# Patient Record
Sex: Female | Born: 1970 | Race: White | Hispanic: No | Marital: Married | Smoking: Former smoker
Health system: Southern US, Community
[De-identification: ages and names within clinical notes are randomized; demographics above are authoritative.]

## PROBLEM LIST (undated history)

## (undated) DIAGNOSIS — F419 Anxiety disorder, unspecified: Secondary | ICD-10-CM

## (undated) DIAGNOSIS — C50919 Malignant neoplasm of unspecified site of unspecified female breast: Secondary | ICD-10-CM

## (undated) HISTORY — PX: MANDIBLE FRACTURE SURGERY: SHX706

## (undated) HISTORY — PX: MASTECTOMY W/ SENTINEL NODE BIOPSY: SHX2001

## (undated) HISTORY — DX: Malignant neoplasm of unspecified site of unspecified female breast: C50.919

## (undated) HISTORY — PX: APPENDECTOMY: SHX54

## (undated) HISTORY — PX: LASIK: SHX215

## (undated) HISTORY — DX: Anxiety disorder, unspecified: F41.9

---

## 2008-10-03 DIAGNOSIS — C50919 Malignant neoplasm of unspecified site of unspecified female breast: Secondary | ICD-10-CM

## 2008-10-03 HISTORY — DX: Malignant neoplasm of unspecified site of unspecified female breast: C50.919

## 2014-07-03 ENCOUNTER — Other Ambulatory Visit: Payer: Self-pay | Admitting: Hematology and Oncology

## 2014-07-03 ENCOUNTER — Other Ambulatory Visit: Payer: Self-pay | Admitting: *Deleted

## 2014-07-03 DIAGNOSIS — Z9011 Acquired absence of right breast and nipple: Secondary | ICD-10-CM

## 2014-07-03 DIAGNOSIS — Z9012 Acquired absence of left breast and nipple: Secondary | ICD-10-CM

## 2014-07-03 DIAGNOSIS — N644 Mastodynia: Secondary | ICD-10-CM

## 2014-07-04 ENCOUNTER — Ambulatory Visit
Admission: RE | Admit: 2014-07-04 | Discharge: 2014-07-04 | Disposition: A | Discharge status: Home / Self Care | Payer: 59 | Source: Ambulatory Visit | Attending: *Deleted | Admitting: *Deleted

## 2014-07-04 DIAGNOSIS — N644 Mastodynia: Secondary | ICD-10-CM

## 2014-07-04 DIAGNOSIS — Z9012 Acquired absence of left breast and nipple: Secondary | ICD-10-CM

## 2014-07-04 DIAGNOSIS — Z9011 Acquired absence of right breast and nipple: Secondary | ICD-10-CM

## 2014-10-23 ENCOUNTER — Telehealth: Payer: Self-pay | Admitting: *Deleted

## 2014-10-23 NOTE — Telephone Encounter (Signed)
Received referral from [facility] for med onc appt.  Called pt and obtained past history doctor(s) information.  Informed pt that I would call them to see if they would release records with a signed release.  If not, then I would contact her back to work out a plan to get her signature.  I also informed her that I would be able to get her scheduled w/ Dr. Doris Cheadle once I receive the outside records.  Pt agree's w/ the plan.

## 2014-11-14 ENCOUNTER — Telehealth: Payer: Self-pay | Admitting: *Deleted

## 2014-11-14 DIAGNOSIS — C50912 Malignant neoplasm of unspecified site of left female breast: Secondary | ICD-10-CM

## 2014-11-14 NOTE — Telephone Encounter (Signed)
Spoke to patient and confirmed new patient appointment for 04/14/15 at 4pm.  Mailed intake form and packet to patient.  Contact information given and encouraged her to call with any questions or concerns.

## 2014-12-09 ENCOUNTER — Ambulatory Visit: Payer: Self-pay | Admitting: Gastroenterology

## 2015-02-23 ENCOUNTER — Other Ambulatory Visit: Payer: Self-pay

## 2015-04-13 NOTE — Progress Notes (Signed)
[place]  Telephone:([phone]) [phone] Fax:(336) [phone]     ID: Meghan Russell DOB: 21-Nov-1970  MR#: [id number]  IFO#:[id number]  Patient Care Team: Velna Hatchet, MD as PCP - General (Internal Medicine) PCP: Velna Hatchet, MD GYN: SU:  OTHER MD:  CHIEF COMPLAINT: Triple positive breast cancer  CURRENT TREATMENT: Observation   BREAST CANCER HISTORY: Meghan Russell noted a change in her right breast shortly after the birth of her son in 2010. This took her to mammography, and it turned out the right breast was fine but there was an abnormality in the left breast. On 09/11/2009 she underwent left breast ultrasound-guided biopsy which showed [phone] in [facility]) and invasive carcinoma which was estrogen receptor 81% positive, progesterone receptor 100% positive, HER-2 by IH C was 3.8 (overexpressed), with an MIB-1 of 30%. P53 was 3%. Fish confirmed the HER-2 positivity with a signals ratio of 6.6.  The patient explore various surgical options and decided to undergo bilateral mastectomies with D IEP reconstruction. This was performed on 11/12/2009, with left sentinel lymph node sampling. The accession number is [id number] at [facility] in [place], [place] The right prophylactic mastectomy was benign. On the left side there was no evidence of residual tumor on initial views, but gross recut section showed an infiltrating ductal carcinoma, intermediate grade, measuring 1.2 cm. HER-2 was repeated and was again amplified with a ratio of 6.2 estrogen receptor was 90% positive with moderate intensity, progesterone receptor was 90% positive with strong intensity, Ki-67 was 40%. The single sentinel lymph node was benign.  She received adjuvant chemotherapy in [facility] consisting of docetaxel, carboplatin and trastuzumab x6, trastuzumab was continued to complete a year. Echocardiogram 08/12/2010 showed an ejection fraction in the  70% range  The patient was started on tamoxifen August 2011, took it intermittently until January 2014 (perhaps a total of 2 or 2-1/2 years) before discontinuing it because of problems with hot flashes.  Her subsequent history is as detailed below.  INTERVAL HISTORY: Meghan Russell was evaluated in the breast clinic 04/14/2015.  REVIEW OF SYSTEMS: She goes to [employer] and in general is physically active. A detailed review of systems today was otherwise noncontributory  PAST MEDICAL HISTORY: Past Medical History  Diagnosis Date  . Breast cancer     PAST SURGICAL HISTORY: Past Surgical History  Procedure Laterality Date  . Appendectomy    . Mastectomy w/ sentinel node biopsy    . Lasik      FAMILY HISTORY No family history on file. The patient's parents are still living, both 54 years old as of July 2016. The patient had 2 brothers, no sisters. The only cancer in the family was the patient's mother, diagnosed with colon cancer at age 71. More specifically there is no history of breast or ovarian cancer in the family to the patient's knowledge  GYNECOLOGIC HISTORY:  No LMP recorded. Menarche age 37, first live birth age 77, the patient is [place] P2. Her most recent period was 01/27/2015. 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 Her husband Herbie Baltimore works for [employer] as a Freight forwarder. Their children are [name] 9, and [name] 6. The patient at tends [place]    ADVANCED DIRECTIVES: Not in place   HEALTH MAINTENANCE: History  Substance Use Topics  . Smoking status: Not on file  . Smokeless  tobacco: Not on file  . Alcohol Use: Not on file     Colonoscopy:  PAP: June 2015  Bone density:  Lipid panel:  Allergies  Allergen Reactions  . Sulfa Antibiotics Hives    Current Outpatient Prescriptions  Medication Sig Dispense Refill  .  buPROPion (WELLBUTRIN SR) 150 MG 12 hr tablet Take 150 mg by mouth 2 (two) times daily.  5   No current facility-administered medications for this visit.    OBJECTIVE: Meghan Russell woman in no acute distress  Filed Vitals:   04/14/15 1616  BP: 118/76  Pulse: 82  Temp: 98.1 F (36.7 C)  Resp: 18     Body mass index is 36.09 kg/(m^2).    ECOG FS:0 - Asymptomatic  Ocular: Sclerae unicteric, pupils equal, round and reactive to light Ear-nose-throat: Oropharynx clear and moist Lymphatic: No cervical or supraclavicular adenopathy Lungs no rales or rhonchi, good excursion bilaterally Heart regular rate and rhythm, no murmur appreciated Abd soft, nontender, positive bowel sounds MSK no focal spinal tenderness, no joint edema Neuro: non-focal, well-oriented, appropriate affect Breasts: Status post bilateral mastectomies with D IEP reconstruction. There are no findings of concern on the right. On the left breast upper outer quadrant, towards the nipple, there is an area of induration with slight dimpling. This was identified as fat necrosis on a prior MRI. Both axillae are benign   LAB RESULTS:  CMP     Component Value Date/Time   NA 138 04/14/2015 1604   K 4.0 04/14/2015 1604   CO2 23 04/14/2015 1604   GLUCOSE 87 04/14/2015 1604   BUN 9.9 04/14/2015 1604   CREATININE 0.8 04/14/2015 1604   CALCIUM 9.7 04/14/2015 1604   PROT 7.2 04/14/2015 1604   ALBUMIN 4.3 04/14/2015 1604   AST 14 04/14/2015 1604   ALT 15 04/14/2015 1604   ALKPHOS 88 04/14/2015 1604   BILITOT 0.47 04/14/2015 1604    INo results found for: SPEP, UPEP  Lab Results  Component Value Date   WBC 5.2 04/14/2015   NEUTROABS 3.1 04/14/2015   HGB 13.3 04/14/2015   HCT 39.7 04/14/2015   MCV 87.8 04/14/2015   PLT 217 04/14/2015      Chemistry      Component Value Date/Time   NA 138 04/14/2015 1604   K 4.0 04/14/2015 1604   CO2 23 04/14/2015 1604   BUN 9.9 04/14/2015 1604   CREATININE 0.8 04/14/2015 1604       Component Value Date/Time   CALCIUM 9.7 04/14/2015 1604   ALKPHOS 88 04/14/2015 1604   AST 14 04/14/2015 1604   ALT 15 04/14/2015 1604   BILITOT 0.47 04/14/2015 1604       No results found for: LABCA2  No components found for: LABCA125  No results for input(s): INR in the last 168 hours.  Urinalysis No results found for: COLORURINE, APPEARANCEUR, LABSPEC, PHURINE, GLUCOSEU, HGBUR, BILIRUBINUR, KETONESUR, PROTEINUR, UROBILINOGEN, NITRITE, LEUKOCYTESUR  STUDIES: Outside studies reviewed. In particular on 12/06/2012 the patient underwent bilateral breast MRI at [facility] for evaluation of pain in the left breast with an abnormal ultrasound performed 11/22/2012. The MRI showed an 8 cm area of fat necrosis in the medial aspect of the left breast. There was no MRI evidence of malignancy.  ASSESSMENT: 44 y.o. Meghan Russell woman status post bilateral mastectomies 11/12/2009 for a left sided pT1c N0, stage IA invasive ductal carcinoma, grade 2, triple positive, with an MIB-1 of 40%  (a) status post immediate bilateral  DIEP reconstruction  (b) area of fat necrosis in the left breast documented March 2014  (1) adjuvant chemotherapy consisted of carboplatin, docetaxel and trastuzumab given every 3 weeks 6  (2) trastuzumab was continued to complete a year, with echocardiogram post treatment showing a normal ejection fraction  (3) tamoxifen taken intermittently to total about 2 years, definitively discontinued January 2014  PLAN: I spent approximately one hour with Meghan Russell today reviewing her diagnosis, treatment history, and prognosis. She understands while we are adding pertuzumab to trastuzumab these days, we are doing that mostly in the neoadjuvant setting and she probably would have received the same adjuvant treatment today as she did in 2011  The really new information comes from the SOFT and TEXT studies. This show a significant improvement in prognosis  for Meghan Russell women with estrogen receptor positive breast cancer who undergo chemotherapy and, if they resume menses after chemotherapy, start either goserelin with their anti-estrogens or undergo bilateral salpingo-oophorectomy. Most of the benefit a crude 2 women 35 and under and the benefit in any case was small. The chief point of discussing this today is so Meghan Russell know that it is an option for her.  We then discussed her prognosis, and I would guess after her surgery she would have had a 24% or so chance of disease recurrence. If chemotherapy drops this by one third, bringing it down to 16%, and anti-HER-2 treatment cuts it in half, bringing it down to 8%, then the 2 years of tamoxifen she took may have lowered it an additional 2% or so. This means she has an approximately 6% chance of this breast cancer recurring but more importantly she has a 94% chance of it not coming back.  If she wanted to make those odds even better then she would take anastrozole for at least 3 years. She is interested in considering that once she is menopausal and she did miss her last 2 periods so it is possible that she will become menopausal within the next 2-3 years. I am going to see her on a yearly basis and if she goes for a full year with no. We will check her [place] and estradiol levels and consider anastrozole  We discussed 2 other issues. When she was tested for BRCA1 and 2 I we were not doing the fuller panels we are doing now. She is interested in considering this so I am putting in a referral to our genetics counselor.  A second issue is the area of fat necrosis that she has in the left breast. This is uncomfortable to her. I offered her referral but at this point she wants to think about it.  I will see Meghan Russell again a year from now. She knows to call for any problems that may develop before then.   The patient has a good understanding of the overall plan. She agrees with it. She knows the goal of treatment  in her case is cure. She will call with any problems that may develop before her next visit here.  Chauncey Cruel, MD   04/14/2015 5:25 PM Medical Oncology and Hematology [facility] [address] [place], [place] [address] Tel. [phone]    Fax. [phone]

## 2015-04-14 ENCOUNTER — Other Ambulatory Visit (HOSPITAL_BASED_OUTPATIENT_CLINIC_OR_DEPARTMENT_OTHER): Payer: 59

## 2015-04-14 ENCOUNTER — Ambulatory Visit: Payer: 59

## 2015-04-14 ENCOUNTER — Encounter: Payer: Self-pay | Admitting: Oncology

## 2015-04-14 ENCOUNTER — Encounter (INDEPENDENT_AMBULATORY_CARE_PROVIDER_SITE_OTHER): Payer: Self-pay

## 2015-04-14 ENCOUNTER — Ambulatory Visit (HOSPITAL_BASED_OUTPATIENT_CLINIC_OR_DEPARTMENT_OTHER): Payer: 59 | Admitting: Oncology

## 2015-04-14 VITALS — BP 118/76 | HR 82 | Temp 98.1°F | Resp 18 | Ht 67.0 in | Wt 230.5 lb

## 2015-04-14 DIAGNOSIS — C50912 Malignant neoplasm of unspecified site of left female breast: Secondary | ICD-10-CM

## 2015-04-14 LAB — COMPREHENSIVE METABOLIC PANEL (CC13)
ALK PHOS: 88 U/L (ref 40–150)
ALT: 15 U/L (ref 0–55)
AST: 14 U/L (ref 5–34)
Albumin: 4.3 g/dL (ref 3.5–5.0)
Anion Gap: 10 mEq/L (ref 3–11)
BUN: 9.9 mg/dL (ref 7.0–26.0)
CALCIUM: 9.7 mg/dL (ref 8.4–10.4)
CHLORIDE: 106 meq/L (ref 98–109)
CO2: 23 meq/L (ref 22–29)
CREATININE: 0.8 mg/dL (ref 0.6–1.1)
EGFR: 86 mL/min/{1.73_m2} — ABNORMAL LOW (ref >90)
Glucose: 87 mg/dl (ref 70–140)
POTASSIUM: 4 meq/L (ref 3.5–5.1)
Sodium: 138 mEq/L (ref 136–145)
Total Bilirubin: 0.47 mg/dL (ref 0.20–1.20)
Total Protein: 7.2 g/dL (ref 6.4–8.3)

## 2015-04-14 LAB — CBC WITH DIFFERENTIAL/PLATELET
BASO%: 0.4 % (ref 0.0–2.0)
BASOS ABS: 0 10*3/uL (ref 0.0–0.1)
EOS%: 2.1 % (ref 0.0–7.0)
Eosinophils Absolute: 0.1 10*3/uL (ref 0.0–0.5)
HCT: 39.7 % (ref 34.8–46.6)
HGB: 13.3 g/dL (ref 11.6–15.9)
LYMPH%: 32.4 % (ref 14.0–49.7)
MCH: 29.5 pg (ref 25.1–34.0)
MCHC: 33.6 g/dL (ref 31.5–36.0)
MCV: 87.8 fL (ref 79.5–101.0)
MONO#: 0.3 10*3/uL (ref 0.1–0.9)
MONO%: 5.3 % (ref 0.0–14.0)
NEUT#: 3.1 10*3/uL (ref 1.5–6.5)
NEUT%: 59.8 % (ref 38.4–76.8)
Platelets: 217 10*3/uL (ref 145–400)
RBC: 4.52 10*6/uL (ref 3.70–5.45)
RDW: 13.6 % (ref 11.2–14.5)
WBC: 5.2 10*3/uL (ref 3.9–10.3)
lymph#: 1.7 10*3/uL (ref 0.9–3.3)

## 2015-04-14 NOTE — Progress Notes (Signed)
Checked in new pt with no financial concerns prior to seeing the dr.  Pt has my card for any billing questions, concerns or if financial assistance is needed.  ° °

## 2015-04-15 ENCOUNTER — Telehealth: Payer: Self-pay | Admitting: Oncology

## 2015-04-15 NOTE — Telephone Encounter (Signed)
s.w. pt and advised on Aug and JULY 2017 appt....pt okand aware

## 2015-05-13 ENCOUNTER — Telehealth: Payer: Self-pay | Admitting: Genetic Counselor

## 2015-05-13 NOTE — Telephone Encounter (Signed)
Called Ms. Estill to ask her to bring copy of her previous negative genetic test results from 2010 with her to the appointment, if possible.  Also, she will not be able to make her appt tomorrow and would like to reschedule for Thursday, September 15th at 2 PM.  I have done that and will see her then.

## 2015-05-14 ENCOUNTER — Other Ambulatory Visit: Payer: 59

## 2015-05-14 ENCOUNTER — Encounter: Payer: 59 | Admitting: Genetic Counselor

## 2015-06-18 ENCOUNTER — Encounter: Payer: Self-pay | Admitting: Genetic Counselor

## 2015-06-18 ENCOUNTER — Other Ambulatory Visit: Payer: 59

## 2015-06-18 ENCOUNTER — Ambulatory Visit (HOSPITAL_BASED_OUTPATIENT_CLINIC_OR_DEPARTMENT_OTHER): Payer: 59 | Admitting: Genetic Counselor

## 2015-06-18 DIAGNOSIS — Z853 Personal history of malignant neoplasm of breast: Secondary | ICD-10-CM | POA: Diagnosis not present

## 2015-06-18 DIAGNOSIS — Z800 Family history of malignant neoplasm of digestive organs: Secondary | ICD-10-CM

## 2015-06-18 DIAGNOSIS — Z83719 Family history of colon polyps, unspecified: Secondary | ICD-10-CM

## 2015-06-18 DIAGNOSIS — Z8052 Family history of malignant neoplasm of bladder: Secondary | ICD-10-CM | POA: Diagnosis not present

## 2015-06-18 DIAGNOSIS — Z8371 Family history of colonic polyps: Secondary | ICD-10-CM

## 2015-06-18 DIAGNOSIS — Z8042 Family history of malignant neoplasm of prostate: Secondary | ICD-10-CM

## 2015-06-18 NOTE — Progress Notes (Signed)
REFERRING PROVIDER: Lurline Del, MD  PRIMARY PROVIDER:  Velna Hatchet, MD  PRIMARY REASON FOR VISIT:  1. History of left breast cancer   2. Family history of colon cancer in mother   34. Family history of prostate cancer   4. Family history of bladder cancer   5. Family history of colonic polyps      HISTORY OF PRESENT ILLNESS:   Meghan Russell, a 44 y.o. female, was seen for a  cancer genetics consultation at the request of Dr. Jana Russell due to a personal history of breast cancer diagnosed at 19 and family history of colon, prostate, and other cancers.  Meghan Russell previously had negative genetic testing in 2010, which she reports was negative. Meghan Russell presents to clinic today to discuss the possibility of a hereditary predisposition to cancer, updated genetic testing options, and to further clarify her future cancer risks, as well as potential cancer risks for family members.   In 2010, at the age of 68, Meghan Russell was diagnosed with invasive ductal carcinoma of the left breast along with fibrocystic changes in her right breast.  Hormone receptor status was triple positive. This was treated with bilateral mastectomies.    CANCER HISTORY:   No history exists.     HORMONAL RISK FACTORS:  Menarche was at age 63.  First live birth at age 44.  OCP use for approximately 15 years.  Ovaries intact: yes.  Hysterectomy: no.  Menopausal status: premenopausal.  HRT use: 0 years. Colonoscopy: no; not examined. Mammogram within the last year: n/a. Number of breast biopsies: 1. Up to date with pelvic exams:  yes. Any excessive radiation exposure in the past:  no  Past Medical History  Diagnosis Date  . Breast cancer     Past Surgical History  Procedure Laterality Date  . Appendectomy    . Mastectomy w/ sentinel node biopsy    . Lasik      Social History   Social History  . Marital Status: Married    Spouse Name: N/A  . Number of Children: N/A  . Years of  Education: N/A   Social History Main Topics  . Smoking status: Former Smoker -- 0.25 packs/day for 15 years    Types: Cigarettes    Quit date: 10/03/2013  . Smokeless tobacco: Never Used     Comment: sometimes on and off; did not smoke for 4 yrs following her breast cancer dx  . Alcohol Use: 3.0 oz/week    5 Cans of beer per week     Comment: socially/on the weekends  . Drug Use: None  . Sexual Activity: Not Asked   Other Topics Concern  . None   Social History Narrative     FAMILY HISTORY:  We obtained a detailed, 4-generation family history.  Significant diagnoses are listed below: Family History  Problem Relation Age of Onset  . Colon cancer Mother 49    2-3 colon polyps  . Prostate cancer Maternal Grandfather 85  . Colon cancer Maternal Grandfather     dx. >76  . Bone cancer Maternal Grandfather     dx. >76  . Bladder Cancer Paternal Grandmother 48    Meghan Russell has one daughter, age 46, and one son, age 80.  She has two full brothers, ages 75 and 12, who have never had cancer.  She has three nephews between the ages of 23 and 56.  Meghan Russell mother is currently 10; she has a history of breast cancer at  44.  She reports about 2-3 colon polyps total.  Meghan Russell father is currently 42 and has never had cancer or any colon polyps.    Meghan Russell mother has one full brother and one full sister, ages 11 and 41.  Neither Meghan Russell's maternal aunt or maternal uncle have ever had cancer, nor have their children. Meghan Russell maternal grandmother died at 48 and never had cancer.  Meghan Russell maternal grandfather died at 76.  He was diagnosed with prostate cancer at 66, then was diagnosed with colon cancer, and finally with bone cancer which may have been a metastasis.  He was a smoker.  Meghan Russell has no information for maternal great aunts/uncles or maternal grandparents.  Meghan Russell father has one full sister who is in her early 37s and has never had cancer.  This aunt has one son  and one daughter who have never had cancer.  Meghan Russell paternal grandmother died in her late 86s of a MRSA infection following a hospital stay due to a diagnosis of bladder cancer at 43.  Meghan Russell paternal grandfather died of a stroke following an operation.  Meghan Russell has no further information for any paternal great aunts/uncles or great grandparents.  Patient's maternal ancestors are of [place] descent, and paternal ancestors are of [place] descent. There is no reported Ashkenazi Jewish ancestry. There is no known consanguinity.  GENETIC COUNSELING ASSESSMENT: Meghan Russell is a 44 y.o. female with a personal and family history of cancer which is somewhat suggestive of a hereditary cancer syndrome and predisposition to cancer. We, therefore, discussed and recommended the following at today's visit.   DISCUSSION: We reviewed the characteristics, features and inheritance patterns of hereditary cancer syndromes, particularly those caused by mutations within the BRCA1/2, CHEK2, and Lynch syndrome genes. We also discussed genetic testing, including the appropriate family members to test, the process of testing, insurance coverage and turn-around-time for results. We discussed the implications of a negative, positive and/or variant of uncertain significant result. We recommended Meghan Russell pursue genetic testing for the 32-gene Comprehensive Cancer Panel through [facility] Meghan Pigeon, MD).  The Comprehensive Cancer Panel offered by [facility] includes sequencing and deletion/duplication testing of the following 30 genes: APC, ATM, AXIN2, BARD1, BMPR1A, BRCA1, BRCA2, BRIP1, CDH1, CDK4, CDKN2A, CHEK2, FANCC, MLH1, MSH2, MSH6, MUTYH, NBN, PALB2, PMS2, POLD1, POLE, PTEN, RAD51C, RAD51D, SMAD4, STK11, TP53, VHL, and XRCC2.  This panel also includes deletion/duplication analysis (without sequencing) for two genes, EPCAM and SCG5/GREM1.   Based on [name]'s personal and family history of cancer, she meets  medical criteria for genetic testing. Despite that she meets criteria, she may still have an out of pocket cost. We discussed that if her out of pocket cost for testing is over $100, the laboratory will call and confirm whether she wants to proceed with testing.  If the out of pocket cost of testing is less than $100 she will be billed by the genetic testing laboratory.    PLAN: After considering the risks, benefits, and limitations, Ms. Deloach  provided informed consent to pursue genetic testing and the blood sample was sent to [facility] for analysis of the 32-gene Comprehensive Cancer Panel. Results should be available within approximately 2-3 weeks' time, at which point they will be disclosed by telephone to Ms. Cianci, as will any additional recommendations warranted by these results. Ms. Gibbs will receive a summary of her genetic counseling visit and a copy of her results once available. This information will also  be available in Epic. We encouraged Ms. Hinderman to remain in contact with cancer genetics annually so that we can continuously update the family history and inform her of any changes in cancer genetics and testing that may be of benefit for her family. Ms. Happel questions were answered to her satisfaction today. Our contact information was provided should additional questions or concerns arise.  Thank you for the referral and allowing [place] to share in the care of your patient.   Jeanine Luz, MS Genetic Counselor [email or web address] phone: [phone]  The patient was seen for a total of 60 minutes in face-to-face genetic counseling.  This patient was discussed with Drs. Magrinat, Lindi Adie and/or Burr Medico who agrees with the above.    _______________________________________________________________________ For Office Staff:  Number of people involved in session: 1 Was an Intern/ student involved with case: no

## 2015-07-15 ENCOUNTER — Telehealth: Payer: Self-pay | Admitting: Genetic Counselor

## 2015-07-15 NOTE — Telephone Encounter (Signed)
Discussed with Meghan Russell that her genetic testing is on hold currently because [facility] have left her a couple of messages regarding her out-of-pocket cost and she has to give them the go-ahead before they can proceed any further.  [facility] is quoting an out-of-pocket currently of $1,190.50.  I discussed with her that the billing representative at the lab feels confident that we can get this cost down, but she would need to call them and answer some questions concerning her income and household number, etc.  I also discussed with Meghan Russell that we can also try potentially cheaper options, such as re-requisitioning for a smaller panel test or proceeding with testing through a different lab, in the even that the newest out-of-pocket quote is still not in a comfortable range for her.  Meghan Russell is considering cancelling testing altogether; she says she feels most comfortable at a price point of $100 or less.  She was driving at the time of our discussion, so she asked me to call her cell phone again and leave a message with this information and the lab's number.  She plans to call [facility] to find out more tomorrow.

## 2015-08-07 ENCOUNTER — Telehealth: Payer: Self-pay | Admitting: Genetic Counselor

## 2015-08-07 NOTE — Telephone Encounter (Signed)
Left a message for Meghan Russell that she could call [facility] at [phone] to cancel genetic testing.  She is always welcome to call me if she changes her mind or has any questions.  Additionally, let her know that based on NCCN guidelines for colorectal cancer screening and her mom's colon cancer diagnosis at 16, that she could talk to her doctor about starting colonoscopy screening (since she is over the age of 43 based on the guideline).

## 2015-08-12 ENCOUNTER — Telehealth: Payer: Self-pay | Admitting: Genetic Counselor

## 2015-08-12 NOTE — Telephone Encounter (Signed)
[facility] called to confirm that Meghan Russell let them know she would like to cancel genetic testing.  They faxed over a paper for me to sign.  I did so and faxed back to them.

## 2015-10-16 ENCOUNTER — Telehealth: Payer: Self-pay | Admitting: Genetic Counselor

## 2015-10-16 NOTE — Telephone Encounter (Signed)
Last time, Ms. Brasel and I spoke we discussed that she should have a colonoscopy now since her mother was diagnosed with colon cancer at 53.  She is calling back to find out more about how she should go about setting up a colonoscopy.  I left her a message letting her know that she might need a referral from her PCP, but she could call [facility] at [phone] to find out if she needs one and that will be the number she will need to call to set up her appointment.  She is always welcome to call or email me with further questions.

## 2015-11-05 ENCOUNTER — Encounter: Payer: Self-pay | Admitting: Gastroenterology

## 2015-12-29 ENCOUNTER — Encounter: Payer: Self-pay | Admitting: Gastroenterology

## 2015-12-29 ENCOUNTER — Ambulatory Visit (INDEPENDENT_AMBULATORY_CARE_PROVIDER_SITE_OTHER): Payer: 59 | Admitting: Gastroenterology

## 2015-12-29 VITALS — BP 110/60 | HR 60 | Ht 66.0 in | Wt 233.0 lb

## 2015-12-29 DIAGNOSIS — Z1211 Encounter for screening for malignant neoplasm of colon: Secondary | ICD-10-CM

## 2015-12-29 DIAGNOSIS — Z800 Family history of malignant neoplasm of digestive organs: Secondary | ICD-10-CM | POA: Diagnosis not present

## 2015-12-29 DIAGNOSIS — R740 Nonspecific elevation of levels of transaminase and lactic acid dehydrogenase [LDH]: Secondary | ICD-10-CM

## 2015-12-29 DIAGNOSIS — R7401 Elevation of levels of liver transaminase levels: Secondary | ICD-10-CM

## 2015-12-29 MED ORDER — NA SULFATE-K SULFATE-MG SULF 17.5-3.13-1.6 GM/177ML PO SOLN: 1.0000 | Freq: Once | ORAL | Status: DC

## 2015-12-29 NOTE — Progress Notes (Signed)
HPI :  45 y/o female here in consultation for CRC screening. She has a history of breast cancer and anxiety. Also with history of elevated AST / ALT on review of labs.   She has a family history of colon cancer, mother diagnosed at age 51. Grandfather also had colon cancer in his 33s. She denies any blood in the stools. She denies any constipation or diarrhea at baseline. No abdominal pains otherwise.  No eating, no vomiting. No prior colonoscopy. She generally feels well without complaints.  Patient otherwise had a lab draw done by her primary care and was noted to have elevated liver enzymes as outlined below, AST > ALT. She had lab workup done and is awaiting results her her primary care, and also had a RUQ [place] which she states "looked good" but results not available at this time. 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 She had treatment with Herceptin and surgical treatment.  In remission for 6 years at this time. She was taking ibuprofen at the time of this AST elevation and has since stopped. She also had blood work to workup causes of chronic liver enzymes. She denies any mucles aches / pains. She works out routinely, may have worked out prior to lab.   Labs shows 10/26/15 - AST 146, ALT 45, AP 69, T bil 0.4   Past Medical History  Diagnosis Date  . Breast cancer ([place])   . Anxiety     Past Surgical History  Procedure Laterality Date  . Appendectomy    . Mastectomy w/ sentinel node biopsy    . Lasik     Family History  Problem Relation Age of Onset  . Colon cancer Mother 33    2-3 colon polyps  . Prostate cancer Maternal Grandfather 26  . Colon cancer Maternal Grandfather     dx. >76  . Bone cancer Maternal Grandfather     dx. >76  . Bladder Cancer Paternal Grandmother 87   Social History  Substance Use Topics  . Smoking status: Former Smoker -- 0.25 packs/day for 15 years    Types: Cigarettes    Quit date: 10/03/2013    . Smokeless tobacco: Never Used     Comment: sometimes on and off; did not smoke for 4 yrs following her breast cancer dx  . Alcohol Use: 3.0 oz/week    5 Cans of beer per week     Comment: socially/on the weekends   Current Outpatient Prescriptions  Medication Sig Dispense Refill  . ALPRAZolam (XANAX) 0.25 MG tablet Take 0.25 mg by mouth at bedtime as needed for anxiety.     No current facility-administered medications for this visit.   Allergies  Allergen Reactions  . Sulfa Antibiotics Hives     Review of Systems: All systems reviewed and negative except where noted in HPI.    No results found.  Labs as outlined above  Physical Exam: BP 110/60 mmHg  Pulse 60  Ht '5\' 6"'  (1.676 m)  Wt 233 lb (105.688 kg)  BMI 37.63 kg/m2  LMP 12/06/2015 Constitutional: Pleasant,well-developed, female in no acute distress. HEENT: Normocephalic and atraumatic. Conjunctivae are normal. No scleral icterus. Neck supple.  Cardiovascular: Normal rate, regular rhythm.  Pulmonary/chest: Effort normal and breath sounds normal. No wheezing, rales or rhonchi. Abdominal: Soft, nondistended, nontender. Bowel sounds active throughout. There are no masses palpable. No hepatomegaly. Extremities: no edema Lymphadenopathy: No cervical adenopathy noted. Neurological: Alert and  oriented to person place and time. Skin: Skin is warm and dry. No rashes noted. Psychiatric: Normal mood and affect. Behavior is normal.   ASSESSMENT AND PLAN: 45 y/o female with strong family history of colon cancer, who is due for screening colonoscopy at this time. She is asymptomatic. I discussed colon cancer screening options with her, and given her family history recommend optical colonoscopy. The indications, risks, and benefits of colonoscopy were explained to the patient in detail. Risks include but are not limited to bleeding, perforation, adverse reaction to medications, and cardiopulmonary compromise. Sequelae include  but are not limited to the possibility of surgery, hospitalization, and mortality. The patient verbalized understanding and wished to proceed. All questions answered, referred to the scheduler and bowel prep ordered. Further recommendations pending results of the exam.   She otherwise has recent blood draw showing AST>ALT elevation. She has had a RUQ [place] and labs to rule out chronic liver diseases as well as CK which I agree with. If her AST / ALT elevation persists, I asked her to contact me so we can further evaluate this issue. I can't see results of her labs on our system today, she will have them sent to our office so I can review them.   Hebron Cellar, MD [facility] Pager [phone]  CC: Velna Hatchet, MD

## 2015-12-29 NOTE — Patient Instructions (Signed)

## 2016-01-07 ENCOUNTER — Telehealth: Payer: Self-pay | Admitting: Oncology

## 2016-01-07 NOTE — Telephone Encounter (Signed)
Lvm advising appt chg on 7/12 from [facility] at 4pm to HB at 3.15, and labs prior at 2.45.

## 2016-02-09 ENCOUNTER — Other Ambulatory Visit: Payer: Self-pay | Admitting: Nurse Practitioner

## 2016-02-10 ENCOUNTER — Ambulatory Visit (AMBULATORY_SURGERY_CENTER): Payer: 59 | Admitting: Gastroenterology

## 2016-02-10 ENCOUNTER — Encounter: Payer: Self-pay | Admitting: Gastroenterology

## 2016-02-10 VITALS — BP 138/68 | HR 68 | Temp 98.9°F | Resp 15 | Ht 66.0 in | Wt 233.0 lb

## 2016-02-10 DIAGNOSIS — Z1211 Encounter for screening for malignant neoplasm of colon: Secondary | ICD-10-CM

## 2016-02-10 DIAGNOSIS — D127 Benign neoplasm of rectosigmoid junction: Secondary | ICD-10-CM

## 2016-02-10 DIAGNOSIS — D122 Benign neoplasm of ascending colon: Secondary | ICD-10-CM | POA: Diagnosis not present

## 2016-02-10 DIAGNOSIS — D120 Benign neoplasm of cecum: Secondary | ICD-10-CM | POA: Diagnosis not present

## 2016-02-10 MED ORDER — SODIUM CHLORIDE 0.9 % IV SOLN: 500.0000 mL | INTRAVENOUS | Status: DC

## 2016-02-10 NOTE — Patient Instructions (Signed)
YOU HAD AN ENDOSCOPIC PROCEDURE TODAY AT [facility]:   Refer to the procedure report that was given to you for any specific questions about what was found during the examination.  If the procedure report does not answer your questions, please call your gastroenterologist to clarify.  If you requested that your care partner not be given the details of your procedure findings, then the procedure report has been included in a sealed envelope for you to review at your convenience later.  YOU SHOULD EXPECT: Some feelings of bloating in the abdomen. Passage of more gas than usual.  Walking can help get rid of the air that was put into your GI tract during the procedure and reduce the bloating. If you had a lower endoscopy (such as a colonoscopy or flexible sigmoidoscopy) you may notice spotting of blood in your stool or on the toilet paper. If you underwent a bowel prep for your procedure, you may not have a normal bowel movement for a few days.  Please Note:  You might notice some irritation and congestion in your nose or some drainage.  This is from the oxygen used during your procedure.  There is no need for concern and it should clear up in a day or so.  SYMPTOMS TO REPORT IMMEDIATELY:   Following lower endoscopy (colonoscopy or flexible sigmoidoscopy):  Excessive amounts of blood in the stool  Significant tenderness or worsening of abdominal pains  Swelling of the abdomen that is new, acute  Fever of 100F or higher  For urgent or emergent issues, a gastroenterologist can be reached at any hour by calling [phone].   DIET: Your first meal following the procedure should be a small meal and then it is ok to progress to your normal diet. Heavy or fried foods are harder to digest and may make you feel nauseous or bloated.  Likewise, meals heavy in dairy and vegetables can increase bloating.  Drink plenty of fluids but you should avoid alcoholic beverages for 24  hours.  ACTIVITY:  You should plan to take it easy for the rest of today and you should NOT DRIVE or use heavy machinery until tomorrow (because of the sedation medicines used during the test).    FOLLOW UP: Our staff will call the number listed on your records the next business day following your procedure to check on you and address any questions or concerns that you may have regarding the information given to you following your procedure. If we do not reach you, we will leave a message.  However, if you are feeling well and you are not experiencing any problems, there is no need to return our call.  We will assume that you have returned to your regular daily activities without incident.  Polyp, diverticulosis, high fiber diet information given.  No aspirin, naproxen, ibuprofen, or any other non-steroidal anti -inflammatory meds for two weeks.  If any biopsies were taken you will be contacted by phone or by letter within the next 1-3 weeks.  Please call us at [phone] if you have not heard about the biopsies in 3 weeks.    SIGNATURES/CONFIDENTIALITY: You and/or your care partner have signed paperwork which will be entered into your electronic medical record.  These signatures attest to the fact that that the information above on your After Visit Summary has been reviewed and is understood.  Full responsibility of the confidentiality of this discharge information lies with you and/or your care-partner.

## 2016-02-10 NOTE — Op Note (Signed)
[facility] [facility] Patient Name: Meghan Russell Procedure Date: 02/10/2016 8:58 AM MRN: [id number] Endoscopist: Remo Lipps P. Havery Moros , MD Age: 45 Date of Birth: 06/02/1971 Gender: Female Procedure:                Colonoscopy Indications:              Screening in patient at increased risk: Family                            history of 1st-degree relative with colorectal                            cancer before age 2 years, This is the patient's                            first colonoscopy Medicines:                Monitored Anesthesia Care Procedure:                Pre-Anesthesia Assessment:                           - Prior to the procedure, a History and Physical                            was performed, and patient medications and                            allergies were reviewed. The patient's tolerance of                            previous anesthesia was also reviewed. The risks                            and benefits of the procedure and the sedation                            options and risks were discussed with the patient.                            All questions were answered, and informed consent                            was obtained. Prior Anticoagulants: The patient has                            taken no previous anticoagulant or antiplatelet                            agents. ASA Grade Assessment: II - A patient with                            mild systemic disease. After reviewing the risks  and benefits, the patient was deemed in                            satisfactory condition to undergo the procedure.                           After obtaining informed consent, the colonoscope                            was passed under direct vision. Throughout the                            procedure, the patient's blood pressure, pulse, and                            oxygen saturations were monitored continuously. The   Model PCF-H190L [id number]) scope was introduced                            through the anus and advanced to the the cecum,                            identified by appendiceal orifice and ileocecal                            valve. The colonoscopy was performed without                            difficulty. The patient tolerated the procedure                            well. The quality of the bowel preparation was                            adequate. The ileocecal valve, appendiceal orifice,                            and rectum were photographed. Scope In: 9:06:13 AM Scope Out: 9:32:45 AM Scope Withdrawal Time: 0 hours 23 minutes 21 seconds  Total Procedure Duration: 0 hours 26 minutes 32 seconds  Findings:                 The perianal and digital rectal examinations were                            normal.                           A 5 mm polyp was found in the cecum. The polyp was                            sessile. The polyp was removed with a cold snare.                            Resection and retrieval were  complete.                           A 4 mm polyp was found in the ascending colon. The                            polyp was sessile. The polyp was removed with a                            cold snare. Resection and retrieval were complete.                           A 3 mm polyp was found in the recto-sigmoid colon.                            The polyp was sessile. The polyp was removed with a                            cold snare. Resection and retrieval were complete.                           A few small-mouthed diverticula were found in the                            left colon.                           The exam was otherwise without abnormality. Complications:            No immediate complications. Estimated blood loss:                            Minimal. Estimated Blood Loss:     Estimated blood loss was minimal. Impression:               - One 5 mm polyp in the cecum,  removed with a cold                            snare. Resected and retrieved.                           - One 4 mm polyp in the ascending colon, removed                            with a cold snare. Resected and retrieved.                           - One 3 mm polyp at the recto-sigmoid colon,                            removed with a cold snare. Resected and retrieved.                           - Diverticulosis in the left colon.                           -  The examination was otherwise normal. Recommendation:           - Patient has a contact number available for                            emergencies. The signs and symptoms of potential                            delayed complications were discussed with the                            patient. Return to normal activities tomorrow.                            Written discharge instructions were provided to the                            patient.                           - Resume previous diet.                           - Continue present medications.                           - Await pathology results.                           - No aspirin, ibuprofen, naproxen, or other                            non-steroidal anti-inflammatory drugs for 2 weeks                            after polyp removal.                           - Repeat colonoscopy is recommended for                            surveillance. The colonoscopy date will be                            determined after pathology results from today's                            exam become available for review. Remo Lipps P. Senita Corredor, MD 02/10/2016 9:36:05 AM This report has been signed electronically.

## 2016-02-10 NOTE — Progress Notes (Signed)
Transferred to recovery vss report to [employer]

## 2016-02-10 NOTE — Progress Notes (Signed)
Called to room to assist during endoscopic procedure.  Patient ID and intended procedure confirmed with present staff. Received instructions for my participation in the procedure from the performing physician.  

## 2016-02-11 ENCOUNTER — Telehealth: Payer: Self-pay

## 2016-02-11 NOTE — Telephone Encounter (Signed)
  Follow up Call-  Call back number 02/10/2016  Post procedure Call Back phone  # [phone]  Permission to leave phone message Yes     Patient questions:  Do you have a fever, pain , or abdominal swelling? No. Pain Score  0 *  Have you tolerated food without any problems? Yes.    Have you been able to return to your normal activities? Yes.    Do you have any questions about your discharge instructions: Diet   No. Medications  No. Follow up visit  No.  Do you have questions or concerns about your Care? No.  Actions: * If pain score is 4 or above: No action needed, pain <4.

## 2016-02-17 ENCOUNTER — Encounter: Payer: Self-pay | Admitting: Gastroenterology

## 2016-04-12 ENCOUNTER — Other Ambulatory Visit: Payer: Self-pay

## 2016-04-12 DIAGNOSIS — C50912 Malignant neoplasm of unspecified site of left female breast: Secondary | ICD-10-CM

## 2016-04-12 NOTE — Patient Instructions (Addendum)
Thank you for coming in today!  As we discussed, please continue to perform your self breast exam and report any changes. If you note any new symptoms (please see below), be sure to notify us ASAP.  We'll have you return in one year's time for your next appointment with Dr. Jana Hakim or sooner if you have any problems.  Please check with Dr. Lodema Hong about your thyroid level and see what he says.  Please call the counselors and see if we can find a good fit so that we can work on your anxiety.  Please be sure to stop by scheduling on your way out to make those appointment(s).  Looking forward to working with you in the future!  Let us know if you have any questions!  Symptoms to Watch for and Report to Your Provider  . Changes along your reconstructed breast . New or unusual pain that seems unrelated to an injury and does not go away, including back pain or bone pain . Weight loss without trying/intending . Unexplained bleeding . A rash or allergic reaction, such as swelling, severe itching or wheezing . Chills or fevers . Persistent headaches . Shortness of breath or difficulty breathing . Bloody stools or blood in your urine . Nausea, vomiting, diarrhea, loss of appetite, or trouble swallowing . A cough that does not go away . Abdominal pain . Swelling in your arms or legs . Fractures . Hot flashes or other menopausal symptoms . Any other signs mentioned by your doctor or nurse or any unusual symptoms                 that you just can't explain   NOTE: Just because you have certain symptoms, it doesn't mean the cancer has come back or you have a new cancer. Symptoms can be due to other problems that need to be addressed.  It is important to watch for these symptoms and report them to your provider so you can be medically evaluated for any of these concerns!     Living a Life of Wellness After Cancer:  *Note: Please consult your health care provider before using any medications, supplements,  over-the-counter products, or other interventions.  Also, please consult your primary care provider before you begin any lifestyle program (diet, exercise, etc.).  Your safety is our top priority and we want to make sure you continue to live a long and healthy life!    Healthy Lifestyle Recommendations  As a cancer survivor, it is important develop a lifelong commitment to a healthy lifestyle. A healthy lifestyle can prevent cancer from returning as well as prevent other diseases like heart disease, diabetes and high blood pressure.  These are some things that you can do to have a healthy lifestyle:  Marland Kitchen Maintain a healthy weight.  . Exercise daily per your doctor's orders. . Eat a balanced diet high in fruits, vegetables, bran, and fiber. Limit intake of red meat      and processed foods.  . Limit how much alcohol you consume, if at all. Ali Lowe regular bone mineral density testing for osteoporosis.  . Talk to your doctor about cardiovascular disease or "heart disease" screening. . Stop smoking (if you smoke). . Know your family history. . Be mindful of your emotional, social, and spiritual needs. . Meet regularly with a Primary Care Provider (PCP). Find a PCP if you do not             already have one. . Talk  to your doctor about regular cancer screening including screening for colon           cancer, GYN cancers, and skin cancer.

## 2016-04-12 NOTE — Progress Notes (Signed)
CLINIC:  Cancer Survivorship   REASON FOR VISIT:  Routine follow-up post-treatment for history of breast cancer.  BRIEF ONCOLOGIC HISTORY:    Breast cancer, left breast ([place])   11/12/2009 Definitive Surgery Bilateral mastectomies: right (benign); left - invasive ductal carcinoma, grade 2, ER+, PR+, HER2/neu +, Ki67 40% with immediate bilateral DIEP reconstruction    Chemotherapy Carboplatin, docetaxel and trastuzumab x 6 with maintenace trastuzumab to complete one year of therapy    - 10/2012 Anti-estrogen oral therapy Tamoxifen 20 mg daily; discontinued due to side effects    INTERVAL HISTORY:  Meghan Russell presents to the [facility] today for ongoing follow up regarding her history of breast cancer. Overall, Ms. Mamaril reports feeling doing well since her last visit with Dr. Jana Hakim last year.  Her insurance did not cover expanded genetic testing, so this was not obtained.  She did undergo colonoscopy in May 2017 due to her mother's history of colorectal cancer at age 39. A tubular adenoma and hyperplastic polyp were identified without evidence of malignacy. She has not noticed any change about either reconstructed breast aside from the known area of fat necrosis along her left breast that we are monitoring. She says that she is interested in seeing the surgeon at [facility] for evaluation of this area as she discussed last year with Dr. Jana Hakim. Her biggest complaint centers around anxiety.  She has been placed on alprazolam, which helps at night, but it makes her sleepy.  She denies any headache, cough, shortness of breath, or bone pain.  She is continuing to menstruate regularly with her last menstrual cycle earlier this month.   She reports a good appetite and denies any weight loss. In fact, she is concerned about the generalized "swelling" she is experiencing and continued weight gain.   REVIEW OF SYSTEMS:  General: Weight gain / swelling.  Denies fever, chills, unintentional weight  loss, or generalized fatigue.  HEENT: Denies visual changes, hearing loss, mouth sores, or difficulty swallowing. Cardiac: Denies palpitations and lower extremity edema.  Respiratory: Denies wheeze or dyspnea on exertion.  Breast: As above. GI: Denies abdominal pain, constipation, diarrhea, nausea, or vomiting.  GU: Denies dysuria, hematuria, vaginal bleeding, vaginal discharge, or vaginal dryness.  Musculoskeletal: As above. Neuro: Denies recent fall or numbness / tingling in her extremities.  Skin: Denies rash, pruritis, or open wounds.  Psych: Anxiety and depression.  Denies insomnia, or memory loss.   A 14-point review of systems was completed and was negative, except as noted above.    PAST MEDICAL/SURGICAL HISTORY:  Past Medical History  Diagnosis Date  . Anxiety   . Breast cancer [facility]) 2010    left breast   Past Surgical History  Procedure Laterality Date  . Appendectomy    . Mastectomy w/ sentinel node biopsy    . Lasik    . Mandible fracture surgery       ALLERGIES:  Allergies  Allergen Reactions  . Sulfa Antibiotics Hives     CURRENT MEDICATIONS:  Current Outpatient Prescriptions on File Prior to Visit  Medication Sig Dispense Refill  . ALPRAZolam (XANAX) 0.25 MG tablet Take 0.25 mg by mouth at bedtime as needed for anxiety.     No current facility-administered medications on file prior to visit.     ONCOLOGIC FAMILY HISTORY:  Family History  Problem Relation Age of Onset  . Colon cancer Mother 76    2-3 colon polyps  . Prostate cancer Maternal Grandfather 20  . Colon cancer Maternal  Grandfather     dx. >76  . Bone cancer Maternal Grandfather     dx. >76  . Bladder Cancer Paternal Grandmother 87     GENETIC COUNSELING/TESTING: Yes, performed 2010: BRCA1/2 negative.  SOCIAL HISTORY:  Azizi Bally is married and lives with her family in [place], [place].  She has 2 children. 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   She is a former smoker and denies any current or history of illicit drug use. She drinks alcohol on a social basis on the weekends.  PHYSICAL EXAMINATION:  Vital Signs: Filed Vitals:   04/13/16 1414  BP: 116/74  Pulse: 76  Temp: 98.4 F (36.9 C)  Resp: 18   Weight: 242 (increase of 12# since her visit last year.) ECOG performance status: 0 General: Well-nourished, well-appearing female in no acute distress.  She is unaccompanied in clinic today.   HEENT: Head is atraumatic and normocephalic.  Pupils equal and reactive to light and accomodation. Conjunctivae clear without exudate.  Sclerae anicteric. Oral mucosa is pink, moist, and intact without lesions.  Oropharynx is pink without lesions or erythema.  Lymph: No cervical, supraclavicular, infraclavicular, or axillary lymphadenopathy noted on palpation.  Cardiovascular: Regular rate and rhythm without murmurs, rubs, or gallops. Respiratory: Clear to auscultation bilaterally. Chest expansion symmetric without accessory muscle use on inspiration or expiration.  Breast: Bilateral breast exam performed.  Bilaterally reconstructed breasts S/P DIEP with excellent cosmesis.  Small area of induration and hardness previously identified as consistent with fat necrosis at left breast. GI: Abdomen soft and round. No tenderness to palpation. Bowel sounds normoactive in 4 quadrants. No hepatosplenomegaly.   GU: Deferred.  Musculoskeletal: Muscle strength 5/5 in all extremities.   Neuro: No focal deficits. Steady gait.  Psych: Mood and affect normal and appropriate for situation.  Extremities: No edema, cyanosis, or clubbing.  Skin: Warm and dry. No open lesions noted.   LABORATORY DATA:  Recent Results (from the past 2160 hour(s))  CBC with Differential     Status: None   Collection Time: 04/13/16  1:57 PM  Result Value Ref Range   WBC 5.6 3.9 - 10.3 10e3/uL   NEUT# 3.5 1.5 - 6.5 10e3/uL   HGB 13.4 11.6 - 15.9 g/dL   HCT 40.9 34.8 - 46.6 %    Platelets 177 145 - 400 10e3/uL   MCV 87.1 79.5 - 101.0 fL   MCH 28.5 25.1 - 34.0 pg   MCHC 32.7 31.5 - 36.0 g/dL   RBC 4.69 3.70 - 5.45 10e6/uL   RDW 14.1 11.2 - 14.5 %   lymph# 1.6 0.9 - 3.3 10e3/uL   MONO# 0.3 0.1 - 0.9 10e3/uL   Eosinophils Absolute 0.1 0.0 - 0.5 10e3/uL   Basophils Absolute 0.0 0.0 - 0.1 10e3/uL   NEUT% 62.2 38.4 - 76.8 %   LYMPH% 29.0 14.0 - 49.7 %   MONO% 6.1 0.0 - 14.0 %   EOS% 2.1 0.0 - 7.0 %   BASO% 0.6 0.0 - 2.0 %  Comprehensive metabolic panel     Status: Abnormal   Collection Time: 04/13/16  1:57 PM  Result Value Ref Range   Sodium 138 136 - 145 mEq/L   Potassium 3.9 3.5 - 5.1 mEq/L   Chloride 102 98 - 109 mEq/L   CO2 25 22 - 29 mEq/L   Glucose 123 70 - 140 mg/dl    Comment: Glucose reference range is for nonfasting patients. Fasting glucose reference range is 70- 100.  BUN 11.1 7.0 - 26.0 mg/dL   Creatinine 0.8 0.6 - 1.1 mg/dL   Total Bilirubin 0.41 0.20 - 1.20 mg/dL   Alkaline Phosphatase 71 40 - 150 U/L   AST 22 5 - 34 U/L   ALT 19 0 - 55 U/L   Total Protein 7.4 6.4 - 8.3 g/dL   Albumin 4.0 3.5 - 5.0 g/dL   Calcium 9.7 8.4 - 10.4 mg/dL   Anion Gap 11 3 - 11 mEq/L   EGFR 90 (L) >90 ml/min/1.73 m2    Comment: eGFR is calculated using the CKD-EPI Creatinine Equation (2009)        ASSESSMENT AND PLAN:   1. 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  Ms. Casco is doing well with no clinical symptoms worrisome for cancer recurrence at this time. I have reviewed the recommendations for ongoing surveillance with her and she will follow-up with her medical oncologist,  Dr. Jana Hakim, in July 2018   with history and physical exam per surveillance protocol.  As she is still menstruating, we will not check her [place] / LH / estradiol levels at this time. She will continue her anti-estrogen therapy with tamoxifen at this time and report any new side effects.  She was also instructed to make [place] aware if she notes any change within her breast, any new symptoms such as pain, shortness of breath, weight loss, or fatigue.  Though the incidence is low, there is an associated risk of endometrial cancer with anti-estrogen therapies like Tamoxifen.  Ms. Bobeck was encouraged to contact Dr. Julaine Fusi any vaginal bleeding while taking Tamoxifen. Other side effects of Tamoxifen were again reviewed with her as well.  I have given her the names of Dr. Isa Rankin [facility]) and Harle Stanford (in [facility]) to contact regarding her fat necrosis and asked her to notify us if she has difficulty making those appointments.    2. Anxiety: Ms. Mentel attempted to make an appointment with the psychologist recommended to her by her PCP, Dr. Ardeth Perfect, but unfortunately, her insurance did not cover this provider.  She has the name of some additional providers and I have also recommended to her some potential providers to contact.  I do believe that she needs a long acting agent as well as counseling to aid in her symptoms.  She is not depressed today.  She is very concerned about additional weight gain, which may be problematic with the side effect profile of the various agents.  She has agreed to reach out to the providers ASAP as it may take time to get her in.  3. Weight gain / swelling: I believe that Ms. Neira's complaints of swelling are more related to the weight gain rather than swelling.  Her laboratory functions are WNL today and she shows no evidence of edema or effusion.  I have encouraged her to contact Dr. Ardeth Perfect to see if he has checked a TSH level to see if this is impacting her weight gain.  She states that she is  watching her carbohydrate intake and exercising regularly.  She has met with a nutritionist, as well.    4. Cancer screening:  Due to Ms. Eley's history and her age, she should  receive screening for skin cancers, colon cancer (per her gastroenterologist's recommendations), and gynecologic cancers.  The information and recommendations were shared with the patient and in her written after visit summary.  5. Health maintenance and wellness promotion:Ms. Collings and I discussed recommendations to maximize nutrition and minimize recurrence, such as increased intake of fruits, vegetables, lean proteins, and minimizing the intake of red meats and processed foods.  She was also encouraged to engage in moderate to vigorous exercise for 30 minutes per day most days of the week. She was instructed to limit her alcohol consumption and continue to abstain from tobacco use.    6. Support services/counseling:Ms. Leclaire was offered support today through active listening and expressive supportive counseling.     A total of 30 minutes of face-to-face time was spent with this patient with greater than 50% of that time in counseling and care-coordination.   Sylvan Cheese, NP  Survivorship Program [facility] [phone]   Note: PRIMARY CARE PROVIDER Velna Hatchet, [place] [phone]

## 2016-04-13 ENCOUNTER — Ambulatory Visit (HOSPITAL_BASED_OUTPATIENT_CLINIC_OR_DEPARTMENT_OTHER): Payer: 59 | Admitting: Nurse Practitioner

## 2016-04-13 ENCOUNTER — Other Ambulatory Visit (HOSPITAL_BASED_OUTPATIENT_CLINIC_OR_DEPARTMENT_OTHER): Payer: 59

## 2016-04-13 ENCOUNTER — Encounter: Payer: Self-pay | Admitting: Nurse Practitioner

## 2016-04-13 ENCOUNTER — Telehealth: Payer: Self-pay | Admitting: Nurse Practitioner

## 2016-04-13 VITALS — BP 116/74 | HR 76 | Temp 98.4°F | Resp 18 | Wt 242.3 lb

## 2016-04-13 DIAGNOSIS — Z853 Personal history of malignant neoplasm of breast: Secondary | ICD-10-CM | POA: Diagnosis not present

## 2016-04-13 DIAGNOSIS — R609 Edema, unspecified: Secondary | ICD-10-CM | POA: Diagnosis not present

## 2016-04-13 DIAGNOSIS — R635 Abnormal weight gain: Secondary | ICD-10-CM

## 2016-04-13 DIAGNOSIS — F419 Anxiety disorder, unspecified: Secondary | ICD-10-CM | POA: Diagnosis not present

## 2016-04-13 DIAGNOSIS — C50912 Malignant neoplasm of unspecified site of left female breast: Secondary | ICD-10-CM

## 2016-04-13 LAB — COMPREHENSIVE METABOLIC PANEL
ALBUMIN: 4 g/dL (ref 3.5–5.0)
ALT: 19 U/L (ref 0–55)
AST: 22 U/L (ref 5–34)
Alkaline Phosphatase: 71 U/L (ref 40–150)
Anion Gap: 11 mEq/L (ref 3–11)
BILIRUBIN TOTAL: 0.41 mg/dL (ref 0.20–1.20)
BUN: 11.1 mg/dL (ref 7.0–26.0)
CO2: 25 meq/L (ref 22–29)
CREATININE: 0.8 mg/dL (ref 0.6–1.1)
Calcium: 9.7 mg/dL (ref 8.4–10.4)
Chloride: 102 mEq/L (ref 98–109)
EGFR: 90 mL/min/{1.73_m2} — ABNORMAL LOW (ref >90)
GLUCOSE: 123 mg/dL (ref 70–140)
Potassium: 3.9 mEq/L (ref 3.5–5.1)
SODIUM: 138 meq/L (ref 136–145)
TOTAL PROTEIN: 7.4 g/dL (ref 6.4–8.3)

## 2016-04-13 LAB — CBC WITH DIFFERENTIAL/PLATELET
BASO%: 0.6 % (ref 0.0–2.0)
Basophils Absolute: 0 10*3/uL (ref 0.0–0.1)
EOS%: 2.1 % (ref 0.0–7.0)
Eosinophils Absolute: 0.1 10*3/uL (ref 0.0–0.5)
HCT: 40.9 % (ref 34.8–46.6)
HEMOGLOBIN: 13.4 g/dL (ref 11.6–15.9)
LYMPH%: 29 % (ref 14.0–49.7)
MCH: 28.5 pg (ref 25.1–34.0)
MCHC: 32.7 g/dL (ref 31.5–36.0)
MCV: 87.1 fL (ref 79.5–101.0)
MONO#: 0.3 10*3/uL (ref 0.1–0.9)
MONO%: 6.1 % (ref 0.0–14.0)
NEUT%: 62.2 % (ref 38.4–76.8)
NEUTROS ABS: 3.5 10*3/uL (ref 1.5–6.5)
PLATELETS: 177 10*3/uL (ref 145–400)
RBC: 4.69 10*6/uL (ref 3.70–5.45)
RDW: 14.1 % (ref 11.2–14.5)
WBC: 5.6 10*3/uL (ref 3.9–10.3)
lymph#: 1.6 10*3/uL (ref 0.9–3.3)

## 2016-04-13 NOTE — Telephone Encounter (Signed)
appt made and avs printed °

## 2016-11-18 ENCOUNTER — Encounter (HOSPITAL_COMMUNITY): Payer: Self-pay

## 2017-03-31 ENCOUNTER — Other Ambulatory Visit: Payer: Self-pay

## 2017-03-31 DIAGNOSIS — C50912 Malignant neoplasm of unspecified site of left female breast: Secondary | ICD-10-CM

## 2017-04-02 NOTE — Progress Notes (Signed)
[facility]  Telephone:([phone]) [phone] [phone]) [phone]     ID: Meghan Russell DOB: Dec 13, 1970  MR#: [id number]  QUI#:[id number]  Patient Care Team: Velna Hatchet, MD as PCP - General (Internal Medicine) Trell Secrist, Virgie Dad, MD as Consulting Physician (Oncology) Armbruster, Renelda Loma, MD as Consulting Physician (Gastroenterology) Aloha Gell, MD as Consulting Physician (Obstetrics and Gynecology) PCP: Velna Hatchet, MD OTHER MD:  CHIEF COMPLAINT: Triple positive breast cancer  CURRENT TREATMENT: Observation   BREAST CANCER HISTORY: From the original intake note:  Meghan Russell noted a change in her right breast shortly after the birth of her son in 2010. This took her to mammography, and it turned out the right breast was fine but there was an abnormality in the left breast. On 09/11/2009 she underwent left breast ultrasound-guided biopsy which showed [phone] in [facility]) and invasive carcinoma which was estrogen receptor 81% positive, progesterone receptor 100% positive, HER-2 by IH C was 3.8 (overexpressed), with an MIB-1 of 30%. P53 was 3%. Fish confirmed the HER-2 positivity with a signals ratio of 6.6.  The patient explore various surgical options and decided to undergo bilateral mastectomies with D IEP reconstruction. This was performed on 11/12/2009, with left sentinel lymph node sampling. The accession number is [id number] at [facility] in [place], [place] The right prophylactic mastectomy was benign. On the left side there was no evidence of residual tumor on initial views, but gross recut section showed an infiltrating ductal carcinoma, intermediate grade, measuring 1.2 cm. HER-2 was repeated and was again amplified with a ratio of 6.2 estrogen receptor was 90% positive with moderate intensity, progesterone receptor was 90% positive with strong intensity, Ki-67 was 40%. The single sentinel lymph node  was benign.  She received adjuvant chemotherapy in [facility] consisting of docetaxel, carboplatin and trastuzumab x6, trastuzumab was continued to complete a year. Echocardiogram 08/12/2010 showed an ejection fraction in the 70% range  The patient was started on tamoxifen August 2011, took it intermittently until January 2014 (perhaps a total of 2 or 2-1/2 years) before discontinuing it because of problems with hot flashes.  Her subsequent history is as detailed below.  INTERVAL HISTORY: Meghan Russell returns today for follow-up of her triple negative breast cancer. We had discussed anti-estrogens previously extensively and she decided against. Accordingly she is being followed with observation alone.  REVIEW OF SYSTEMS: The biggest problem she is having his weight control. She tells me she has kept a food Diary 4 years and it really hasn't helped. Her gynecologist  just started on a combination drug involving phentermine and she thinks this is starting to work for her. She is exercising some, but not very persistently. She is on a fair diet Aside from this issue and some anxiety relating to her breast cancer diagnosis, a detailed review of systems today was stable  PAST MEDICAL HISTORY: Past Medical History:  Diagnosis Date  . Anxiety   . Breast cancer ([place]) 2010   left breast    PAST SURGICAL HISTORY: Past Surgical History:  Procedure Laterality Date  . APPENDECTOMY    . LASIK    . MANDIBLE FRACTURE SURGERY    . MASTECTOMY W/ SENTINEL NODE BIOPSY      FAMILY HISTORY Family History  Problem Relation Age of Onset  . Colon cancer Mother 18       2-3 colon polyps  . Prostate cancer Maternal Grandfather 31  . Colon cancer Maternal Grandfather  dx. >76  . Bone cancer Maternal Grandfather        dx. >76  . Bladder Cancer Paternal Grandmother 45   The patient's parents are still living, both 77 years old as of July 2016. The patient had 2 brothers, no sisters. The only cancer in  the family was the patient's mother, diagnosed with colon cancer at age 30. More specifically there is no history of breast or ovarian cancer in the family to the patient's knowledge  GYNECOLOGIC HISTORY:  No LMP recorded. Menarche age 73, first live birth age 89, the patient is [place] P2. Her most recent period was 01/27/2015. 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 Her husband Meghan Russell works for [employer] as a Freight forwarder. Their children are [name] 9, and [name] 6. The patient at tends [place]    ADVANCED DIRECTIVES: Not in place   HEALTH MAINTENANCE: Social History  Substance Use Topics  . Smoking status: Former Smoker    Packs/day: 0.25    Years: 15.00    Types: Cigarettes    Quit date: 10/03/2013  . Smokeless tobacco: Never Used     Comment: sometimes on and off; did not smoke for 4 yrs following her breast cancer dx  . Alcohol use 3.0 oz/week    5 Cans of beer per week     Comment: socially/on the weekends     Colonoscopy:  PAP: June 2015  Bone density:  Lipid panel:  Allergies  Allergen Reactions  . Sulfa Antibiotics Hives    Current Outpatient Prescriptions  Medication Sig Dispense Refill  . ALPRAZolam (XANAX) 0.25 MG tablet Take 0.25 mg by mouth at bedtime as needed for anxiety.     No current facility-administered medications for this visit.     OBJECTIVE: Meghan Russell woman Who appears well  Vitals:   04/03/17 1452  BP: 137/79  Pulse: 69  Resp: 18  Temp: 98.2 F (36.8 C)     Body mass index is 38.11 kg/m.    ECOG FS:0 - Asymptomatic   Filed Weights   04/03/17 1452  Weight: 236 lb 1.6 oz (107.1 kg)    Sclerae unicteric, EOMs intact Oropharynx clear and moist No cervical or supraclavicular adenopathy Lungs no rales or rhonchi Heart regular rate and rhythm Abd soft, nontender, positive bowel sounds MSK no  focal spinal tenderness, no upper extremity lymphedema Neuro: nonfocal, well oriented, appropriate affect Breasts: She status post bilateral mastectomies with D IEP reconstruction. She continues to have an area of induration in the upper-outer quadrant of the left breast which is unchanged. This has been found to be most likely fat necrosis. Both axillae are benign.  LAB RESULTS:  CMP     Component Value Date/Time   NA 140 04/03/2017 1432   K 3.8 04/03/2017 1432   CO2 21 (L) 04/03/2017 1432   GLUCOSE 93 04/03/2017 1432   BUN 9.6 04/03/2017 1432   CREATININE 0.8 04/03/2017 1432   CALCIUM 9.6 04/03/2017 1432   PROT 7.2 04/03/2017 1432   ALBUMIN 4.2 04/03/2017 1432   AST 21 04/03/2017 1432   ALT 22 04/03/2017 1432   ALKPHOS 68 04/03/2017 1432   BILITOT 0.42 04/03/2017 1432    INo results found for: SPEP, UPEP  Lab Results  Component Value Date   WBC 4.8 04/03/2017   NEUTROABS 2.9 04/03/2017   HGB 13.3 04/03/2017  HCT 39.8 04/03/2017   MCV 87.9 04/03/2017   PLT 197 04/03/2017      Chemistry      Component Value Date/Time   NA 140 04/03/2017 1432   K 3.8 04/03/2017 1432   CO2 21 (L) 04/03/2017 1432   BUN 9.6 04/03/2017 1432   CREATININE 0.8 04/03/2017 1432      Component Value Date/Time   CALCIUM 9.6 04/03/2017 1432   ALKPHOS 68 04/03/2017 1432   AST 21 04/03/2017 1432   ALT 22 04/03/2017 1432   BILITOT 0.42 04/03/2017 1432       No results found for: LABCA2  No components found for: LABCA125  No results for input(s): INR in the last 168 hours.  Urinalysis No results found for: COLORURINE, APPEARANCEUR, LABSPEC, PHURINE, GLUCOSEU, HGBUR, BILIRUBINUR, KETONESUR, PROTEINUR, UROBILINOGEN, NITRITE, LEUKOCYTESUR  STUDIES: Outside studies reviewed. In particular on 12/06/2012 the patient underwent bilateral breast MRI at [facility] for evaluation of pain in the left breast with an abnormal ultrasound performed 11/22/2012. The MRI  showed an 8 cm area of fat necrosis in the medial aspect of the left breast. There was no MRI evidence of malignancy.  ASSESSMENT: 46 y.o. [place] woman status post bilateral mastectomies 11/12/2009 for a left sided pT1c N0, stage IA invasive ductal carcinoma, grade 2, triple positive, with an MIB-1 of 40%  (a) status post immediate bilateral DIEP reconstruction  (b) area of fat necrosis in the left breast documented March 2014  (1) adjuvant chemotherapy consisted of carboplatin, docetaxel and trastuzumab given every 3 weeks 6  (2) trastuzumab was continued to complete a year, with echocardiogram post treatment showing a normal ejection fraction  (3) tamoxifen taken intermittently to total about 2 years, definitively discontinued January 2014  (4) genetics testing recommended but refused by the patient--initially because of cost concerns, subsequently because she "doesn't want to know".. We had planned to request r the 32-gene Comprehensive Cancer Panel through [facility] Hope Pigeon, MD).  The Comprehensive Cancer Panel offered by [facility] includes sequencing and deletion/duplication testing of the following 30 genes: APC, ATM, AXIN2, BARD1, BMPR1A, BRCA1, BRCA2, BRIP1, CDH1, CDK4, CDKN2A, CHEK2, FANCC, MLH1, MSH2, MSH6, MUTYH, NBN, PALB2, PMS2, POLD1, POLE, PTEN, RAD51C, RAD51D, SMAD4, STK11, TP53, VHL, and XRCC2.  This panel also includes deletion/duplication analysis (without sequencing) for two genes, EPCAM and SCG5/GREM1.  (a) colonoscopy was recommended and performed 02/10/2016 showing 3 tubular adenomas with without high-grade dysplasia  PLAN: Jamyiah is now more than 7 years out from definitive surgery for her breast cancer with no evidence of disease recurrence. This is very favorable.  Nevertheless I spent more than 30 minutes reviewing her situation. On the one hand she is very anxious that she is going to have a recurrence. She tells me it is very meaningful an important  for her to come see [place] once a year and review "everything".  At the same time the 2 things that she could do to clarify or reduce her risk of breast cancer, namely to do the genetics testing and to take an antiestrogen, are problematic for her.  Today we discussed bilateral salpingo-oophorectomy. This in itself has been shown to reduce the risk of breast cancer in premenopausal women. In addition it might obviate the need for genetics testing since she already has had bilateral mastectomies: If she had a BRCA mutation for example all we would do in addition to what we are doing is bilateral salpingo-oophorectomy.  Of course she could consider genetics  testing to clarify her family situation. She is considering it on by basis.  Finally she does undergo bilateral salpingo-oophorectomy she would be a candidate for anastrozole, different type of anti-estrogen. Recall she did not tolerate tamoxifen  After all this discussion she is going to consider bilateral salpingo-oophorectomy and will discuss this with Dr. Pamala Hurry.  If she does have that procedure she should return to see me after the surgery to discuss her new options. Otherwise she will see me again in one year.  She knows to call for any other problems that may develop before that visit.  Chauncey Cruel, MD   04/04/2017 8:33 PM Medical Oncology and Hematology [facility] [address] [place], [place] [address] Tel. [phone]    Fax. [phone]

## 2017-04-03 ENCOUNTER — Ambulatory Visit (HOSPITAL_BASED_OUTPATIENT_CLINIC_OR_DEPARTMENT_OTHER): Payer: 59 | Admitting: Oncology

## 2017-04-03 ENCOUNTER — Other Ambulatory Visit (HOSPITAL_BASED_OUTPATIENT_CLINIC_OR_DEPARTMENT_OTHER): Payer: 59

## 2017-04-03 DIAGNOSIS — Z170 Estrogen receptor positive status [ER+]: Principal | ICD-10-CM

## 2017-04-03 DIAGNOSIS — C50812 Malignant neoplasm of overlapping sites of left female breast: Secondary | ICD-10-CM

## 2017-04-03 DIAGNOSIS — Z853 Personal history of malignant neoplasm of breast: Secondary | ICD-10-CM | POA: Diagnosis not present

## 2017-04-03 DIAGNOSIS — C50912 Malignant neoplasm of unspecified site of left female breast: Secondary | ICD-10-CM

## 2017-04-03 LAB — COMPREHENSIVE METABOLIC PANEL
ALBUMIN: 4.2 g/dL (ref 3.5–5.0)
ALT: 22 U/L (ref 0–55)
ANION GAP: 10 meq/L (ref 3–11)
AST: 21 U/L (ref 5–34)
Alkaline Phosphatase: 68 U/L (ref 40–150)
BUN: 9.6 mg/dL (ref 7.0–26.0)
CALCIUM: 9.6 mg/dL (ref 8.4–10.4)
CO2: 21 mEq/L — ABNORMAL LOW (ref 22–29)
Chloride: 108 mEq/L (ref 98–109)
Creatinine: 0.8 mg/dL (ref 0.6–1.1)
EGFR: 84 mL/min/{1.73_m2} — AB (ref >90)
Glucose: 93 mg/dl (ref 70–140)
POTASSIUM: 3.8 meq/L (ref 3.5–5.1)
Sodium: 140 mEq/L (ref 136–145)
Total Bilirubin: 0.42 mg/dL (ref 0.20–1.20)
Total Protein: 7.2 g/dL (ref 6.4–8.3)

## 2017-04-03 LAB — CBC WITH DIFFERENTIAL/PLATELET
BASO%: 0.6 % (ref 0.0–2.0)
Basophils Absolute: 0 10*3/uL (ref 0.0–0.1)
EOS ABS: 0.1 10*3/uL (ref 0.0–0.5)
EOS%: 1.2 % (ref 0.0–7.0)
HEMATOCRIT: 39.8 % (ref 34.8–46.6)
HEMOGLOBIN: 13.3 g/dL (ref 11.6–15.9)
LYMPH#: 1.5 10*3/uL (ref 0.9–3.3)
LYMPH%: 31.4 % (ref 14.0–49.7)
MCH: 29.4 pg (ref 25.1–34.0)
MCHC: 33.5 g/dL (ref 31.5–36.0)
MCV: 87.9 fL (ref 79.5–101.0)
MONO#: 0.3 10*3/uL (ref 0.1–0.9)
MONO%: 6.3 % (ref 0.0–14.0)
NEUT#: 2.9 10*3/uL (ref 1.5–6.5)
NEUT%: 60.5 % (ref 38.4–76.8)
Platelets: 197 10*3/uL (ref 145–400)
RBC: 4.53 10*6/uL (ref 3.70–5.45)
RDW: 13.7 % (ref 11.2–14.5)
WBC: 4.8 10*3/uL (ref 3.9–10.3)

## 2017-04-13 ENCOUNTER — Other Ambulatory Visit: Payer: 59

## 2017-04-13 ENCOUNTER — Ambulatory Visit: Payer: 59 | Admitting: Oncology

## 2018-02-13 ENCOUNTER — Inpatient Hospital Stay: Payer: No Typology Code available for payment source | Attending: Adult Health | Admitting: Adult Health

## 2018-02-13 ENCOUNTER — Telehealth: Payer: Self-pay | Admitting: *Deleted

## 2018-02-13 ENCOUNTER — Encounter: Payer: Self-pay | Admitting: Adult Health

## 2018-02-13 VITALS — BP 120/81 | HR 86 | Temp 97.7°F | Resp 18 | Ht 66.0 in | Wt 231.6 lb

## 2018-02-13 DIAGNOSIS — Z8371 Family history of colonic polyps: Secondary | ICD-10-CM | POA: Diagnosis not present

## 2018-02-13 DIAGNOSIS — Z800 Family history of malignant neoplasm of digestive organs: Secondary | ICD-10-CM | POA: Diagnosis not present

## 2018-02-13 DIAGNOSIS — Z87891 Personal history of nicotine dependence: Secondary | ICD-10-CM

## 2018-02-13 DIAGNOSIS — Z853 Personal history of malignant neoplasm of breast: Secondary | ICD-10-CM | POA: Diagnosis not present

## 2018-02-13 DIAGNOSIS — C50812 Malignant neoplasm of overlapping sites of left female breast: Secondary | ICD-10-CM

## 2018-02-13 DIAGNOSIS — F419 Anxiety disorder, unspecified: Secondary | ICD-10-CM | POA: Diagnosis not present

## 2018-02-13 DIAGNOSIS — Z79899 Other long term (current) drug therapy: Secondary | ICD-10-CM | POA: Diagnosis not present

## 2018-02-13 DIAGNOSIS — Z9221 Personal history of antineoplastic chemotherapy: Secondary | ICD-10-CM | POA: Diagnosis not present

## 2018-02-13 DIAGNOSIS — Z170 Estrogen receptor positive status [ER+]: Secondary | ICD-10-CM

## 2018-02-13 NOTE — Progress Notes (Signed)
[place]  [phone] Fax:([phone]) [phone]     ID: Meghan Russell DOB: 01/26/1971  MR#: [id number]  OQH#:[id number]  Patient Care Team: Velna Hatchet, MD as PCP - General (Internal Medicine) Magrinat, Virgie Dad, MD as Consulting Physician (Oncology) Armbruster, Carlota Raspberry, MD as Consulting Physician (Gastroenterology) Aloha Gell, MD as Consulting Physician (Obstetrics and Gynecology) PCP: Velna Hatchet, MD OTHER MD:  CHIEF COMPLAINT: Triple positive breast cancer  CURRENT TREATMENT: Observation   BREAST CANCER HISTORY: From the original intake note:  Meghan Russell noted a change in her right breast shortly after the birth of her son in 2010. This took her to mammography, and it turned out the right breast was fine but there was an abnormality in the left breast. On 09/11/2009 she underwent left breast ultrasound-guided biopsy which showed [phone] in [facility]) and invasive carcinoma which was estrogen receptor 81% positive, progesterone receptor 100% positive, HER-2 by IH C was 3.8 (overexpressed), with an MIB-1 of 30%. P53 was 3%. Fish confirmed the HER-2 positivity with a signals ratio of 6.6.  The patient explore various surgical options and decided to undergo bilateral mastectomies with D IEP reconstruction. This was performed on 11/12/2009, with left sentinel lymph node sampling. The accession number is [id number] at [facility] in [place], [place] The right prophylactic mastectomy was benign. On the left side there was no evidence of residual tumor on initial views, but gross recut section showed an infiltrating ductal carcinoma, intermediate grade, measuring 1.2 cm. HER-2 was repeated and was again amplified with a ratio of 6.2 estrogen receptor was 90% positive with moderate intensity, progesterone receptor was 90% positive with strong intensity, Ki-67 was 40%. The single sentinel lymph node was  benign.  She received adjuvant chemotherapy in [facility] consisting of docetaxel, carboplatin and trastuzumab x6, trastuzumab was continued to complete a year. Echocardiogram 08/12/2010 showed an ejection fraction in the 70% range  The patient was started on tamoxifen August 2011, took it intermittently until January 2014 (perhaps a total of 2 or 2-1/2 years) before discontinuing it because of problems with hot flashes.  Her subsequent history is as detailed below.  INTERVAL HISTORY: Meghan Russell returns today for an evaluation of a new breast lump she feels in her left breast.  She noticed it about a week ago.  She has a chronic firmness in her left upper breast that is chronic and unchanged.    REVIEW OF SYSTEMS: Meghan Russell denies any other breast changes.  She did have an ultrasound of her breasts (no regular mammo due to mastectomy and reconstruction) that noted an area of fat necrosis in the left breast in 2015, but did recommend MRI breast would be better way to evaluate the breasts if any issues arose in the future.  Meghan Russell denies fevers, chills, pain, or any other concerns today.  A detailed ROS was otherwise non contributory.    PAST MEDICAL HISTORY: Past Medical History:  Diagnosis Date  . Anxiety   . Breast cancer ([place]) 2010   left breast    PAST SURGICAL HISTORY: Past Surgical History:  Procedure Laterality Date  . APPENDECTOMY    . LASIK    . MANDIBLE FRACTURE SURGERY    . MASTECTOMY W/ SENTINEL NODE BIOPSY      FAMILY HISTORY Family History  Problem Relation Age of Onset  . Colon cancer Mother 39       2-3 colon polyps  . Prostate cancer Maternal Grandfather 2  .  Colon cancer Maternal Grandfather        dx. >76  . Bone cancer Maternal Grandfather        dx. >76  . Bladder Cancer Paternal Grandmother 69   The patient's parents are still living, both 27 years old as of July 2016. The patient had 2 brothers, no sisters. The only cancer in the family was the patient's  mother, diagnosed with colon cancer at age 16. More specifically there is no history of breast or ovarian cancer in the family to the patient's knowledge  GYNECOLOGIC HISTORY:  No LMP recorded. Menarche age 89, first live birth age 52, the patient is [place] P2. Her most recent period was 01/27/2015. 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 Her husband Meghan Russell works for [employer] as a Freight forwarder. Their children are Meghan Russell 9, and Meghan Russell 6. The patient at tends [place]    ADVANCED DIRECTIVES: Not in place   HEALTH MAINTENANCE: Social History   Tobacco Use  . Smoking status: Former Smoker    Packs/day: 0.25    Years: 15.00    Pack years: 3.75    Types: Cigarettes    Last attempt to quit: 10/03/2013    Years since quitting: 4.3  . Smokeless tobacco: Never Used  . Tobacco comment: sometimes on and off; did not smoke for 4 yrs following her breast cancer dx  Substance Use Topics  . Alcohol use: Yes    Alcohol/week: 3.0 oz    Types: 5 Cans of beer per week    Comment: socially/on the weekends  . Drug use: No     Colonoscopy:  PAP: June 2015  Bone density:  Lipid panel:  Allergies  Allergen Reactions  . Sulfa Antibiotics Hives    Current Outpatient Medications  Medication Sig Dispense Refill  . ALPRAZolam (XANAX) 0.25 MG tablet Take 0.25 mg by mouth at bedtime as needed for anxiety.     No current facility-administered medications for this visit.     OBJECTIVE:  Vitals:   02/13/18 1420  BP: 120/81  Pulse: 86  Resp: 18  Temp: 97.7 F (36.5 C)  SpO2: 100%     Body mass index is 37.38 kg/m.    ECOG FS:0 - Asymptomatic   Filed Weights   02/13/18 1420  Weight: 231 lb 9.6 oz (105.1 kg)   GENERAL: Patient is a well appearing female in no acute distress HEENT:  Sclerae anicteric.  Oropharynx clear and moist. No ulcerations or  evidence of oropharyngeal candidiasis. Neck is supple.  NODES:  No cervical, supraclavicular, or axillary lymphadenopathy palpated.  BREAST EXAM:  Left breast s/p mastectomy with reconstruction, she has a marble sized nodule in the left breast at 9 oclock, there is a firmness in the upper portion of the breast that is noted also.   LUNGS:  Clear to auscultation bilaterally.  No wheezes or rhonchi. HEART:  Regular rate and rhythm. No murmur appreciated. ABDOMEN:  Soft, nontender.  Positive, normoactive bowel sounds. No organomegaly palpated. MSK:  No focal spinal tenderness to palpation. Full range of motion bilaterally in the upper extremities. EXTREMITIES:  No peripheral edema.   SKIN:  Clear with no obvious rashes or skin changes. No nail dyscrasia. NEURO:  Nonfocal. Well oriented.  Appropriate affect.    LAB RESULTS:  CMP     Component Value Date/Time   NA  140 04/03/2017 1432   K 3.8 04/03/2017 1432   CO2 21 (L) 04/03/2017 1432   GLUCOSE 93 04/03/2017 1432   BUN 9.6 04/03/2017 1432   CREATININE 0.8 04/03/2017 1432   CALCIUM 9.6 04/03/2017 1432   PROT 7.2 04/03/2017 1432   ALBUMIN 4.2 04/03/2017 1432   AST 21 04/03/2017 1432   ALT 22 04/03/2017 1432   ALKPHOS 68 04/03/2017 1432   BILITOT 0.42 04/03/2017 1432    INo results found for: SPEP, UPEP  Lab Results  Component Value Date   WBC 4.8 04/03/2017   NEUTROABS 2.9 04/03/2017   HGB 13.3 04/03/2017   HCT 39.8 04/03/2017   MCV 87.9 04/03/2017   PLT 197 04/03/2017      Chemistry      Component Value Date/Time   NA 140 04/03/2017 1432   K 3.8 04/03/2017 1432   CO2 21 (L) 04/03/2017 1432   BUN 9.6 04/03/2017 1432   CREATININE 0.8 04/03/2017 1432      Component Value Date/Time   CALCIUM 9.6 04/03/2017 1432   ALKPHOS 68 04/03/2017 1432   AST 21 04/03/2017 1432   ALT 22 04/03/2017 1432   BILITOT 0.42 04/03/2017 1432       No results found for: LABCA2  No components found for: LABCA125  No results for  input(s): INR in the last 168 hours.  Urinalysis No results found for: COLORURINE, APPEARANCEUR, LABSPEC, PHURINE, GLUCOSEU, HGBUR, BILIRUBINUR, KETONESUR, PROTEINUR, UROBILINOGEN, NITRITE, LEUKOCYTESUR  STUDIES: Outside studies reviewed. In particular on 12/06/2012 the patient underwent bilateral breast MRI at [place] in [facility] for evaluation of pain in the left breast with an abnormal ultrasound performed 11/22/2012. The MRI showed an 8 cm area of fat necrosis in the medial aspect of the left breast. There was no MRI evidence of malignancy.  ASSESSMENT: 47 y.o. [place] woman status post bilateral mastectomies 11/12/2009 for a left sided pT1c N0, stage IA invasive ductal carcinoma, grade 2, triple positive, with an MIB-1 of 40%  (a) status post immediate bilateral DIEP reconstruction  (b) area of fat necrosis in the left breast documented March 2014  (1) adjuvant chemotherapy consisted of carboplatin, docetaxel and trastuzumab given every 3 weeks 6  (2) trastuzumab was continued to complete a year, with echocardiogram post treatment showing a normal ejection fraction  (3) tamoxifen taken intermittently to total about 2 years, definitively discontinued January 2014  (4) genetics testing recommended but refused by the patient--initially because of cost concerns, subsequently because she "doesn't want to know".. We had planned to request r the 32-gene Comprehensive Cancer Panel through [facility] Hope Pigeon, MD).  The Comprehensive Cancer Panel offered by [facility] includes sequencing and deletion/duplication testing of the following 30 genes: APC, ATM, AXIN2, BARD1, BMPR1A, BRCA1, BRCA2, BRIP1, CDH1, CDK4, CDKN2A, CHEK2, FANCC, MLH1, MSH2, MSH6, MUTYH, NBN, PALB2, PMS2, POLD1, POLE, PTEN, RAD51C, RAD51D, SMAD4, STK11, TP53, VHL, and XRCC2.  This panel also includes deletion/duplication analysis (without sequencing) for two genes, EPCAM and SCG5/GREM1.  (a)  colonoscopy was recommended and performed 02/10/2016 showing 3 tubular adenomas with without high-grade dysplasia  PLAN: Christi is doing well today.  She is understandably anxious.  She has a new breast change/nodule in her left breast. This is the same breast that had the breast cancer.  This will need to be evaluated with MRI.  We will get this done ASAP and will f/u with her about the results as soon as we have them.    She knows to call  for any other problems that may develop before that visit.  A total of (20) minutes of face-to-face time was spent with this patient with greater than 50% of that time in counseling and care-coordination.   Scot Dock, NP   02/13/2018 3:51 PM Medical Oncology and Hematology [facility] [address] [place], [place] [address] Tel. [phone]    Fax. [phone]

## 2018-02-13 NOTE — Telephone Encounter (Signed)
This RN spoke with pt post her VM stating concern due to " new nodule on my left breast that is discolored and indented- has me very concerned "  Note pt had total bilateral mastectomies with DIEP reconstruction.  Per MD - recommended visit today with mid level for assessment for further work up if needed.  Appointment made for [facility]/NP this afternoon - pt contacted with understanding of above- pt stated appreciation.

## 2018-02-14 ENCOUNTER — Telehealth: Payer: Self-pay | Admitting: *Deleted

## 2018-02-14 NOTE — Telephone Encounter (Addendum)
Received call from pt asking about MRI that is to be scheduled.  Called Central Scheduling & they state pt needs recent labs & needs to know when her last mammogram was which needs to be recent.  Imformed Mendel Ryder NP/Nurse [employer] they will f/u.  Informed pt.

## 2018-02-15 ENCOUNTER — Ambulatory Visit (HOSPITAL_COMMUNITY)
Admission: RE | Admit: 2018-02-15 | Discharge: 2018-02-15 | Disposition: A | Discharge status: Home / Self Care | Payer: No Typology Code available for payment source | Source: Ambulatory Visit | Attending: Adult Health | Admitting: Adult Health

## 2018-02-15 DIAGNOSIS — Z170 Estrogen receptor positive status [ER+]: Secondary | ICD-10-CM | POA: Diagnosis not present

## 2018-02-15 DIAGNOSIS — C50812 Malignant neoplasm of overlapping sites of left female breast: Secondary | ICD-10-CM | POA: Diagnosis present

## 2018-02-15 DIAGNOSIS — Z9011 Acquired absence of right breast and nipple: Secondary | ICD-10-CM | POA: Insufficient documentation

## 2018-02-15 DIAGNOSIS — N641 Fat necrosis of breast: Secondary | ICD-10-CM | POA: Diagnosis not present

## 2018-02-15 DIAGNOSIS — Q831 Accessory breast: Secondary | ICD-10-CM | POA: Diagnosis not present

## 2018-02-15 DIAGNOSIS — Z853 Personal history of malignant neoplasm of breast: Secondary | ICD-10-CM | POA: Insufficient documentation

## 2018-02-15 MED ORDER — GADOBENATE DIMEGLUMINE 529 MG/ML IV SOLN
20.0000 mL | Freq: Once | INTRAVENOUS | Status: AC | PRN
  Administered 2018-02-15: 20 mL via INTRAVENOUS

## 2018-02-16 ENCOUNTER — Telehealth: Payer: Self-pay | Admitting: Adult Health

## 2018-02-16 NOTE — Telephone Encounter (Signed)
Called patient to review MRI breast results with her.  Spent about 15 minutes on the phone with her explaining the areas in her breast, and that the radiologists need more information and are requesting further images from her previous MRI in [place] from 2014.  Patient was slightly anxious with this result and requested in many different ways to hear that she doesn't have cancer.  She will call me on Tuesday and we will touch base on the progress of the comparison of the two MRIs.  Wilber Bihari, NP

## 2018-02-17 ENCOUNTER — Ambulatory Visit (HOSPITAL_COMMUNITY): Payer: No Typology Code available for payment source

## 2018-02-19 ENCOUNTER — Ambulatory Visit (HOSPITAL_COMMUNITY): Payer: No Typology Code available for payment source

## 2018-02-19 ENCOUNTER — Encounter: Payer: Self-pay | Admitting: Adult Health

## 2018-02-19 NOTE — Progress Notes (Signed)
Patient called and left vociemail regarding coding questions.  Emailed Freda Munro in coding. She responded and gave me answers in general and not specific due to not being in plan.  Called patient back and provided her with the information provided to me by Freda Munro via email. She verbalized understanding and needed to give additional information to [place]. Transferred to RN.

## 2018-02-21 ENCOUNTER — Telehealth: Payer: Self-pay | Admitting: *Deleted

## 2018-02-21 NOTE — Telephone Encounter (Signed)
This RN received VM from pt - who inquired if comparison reading is available .  Return call number given as [phone].  This note will be forwarded to [facility]/NP per prior communication.

## 2018-02-21 NOTE — Telephone Encounter (Signed)
This RN received VM from pt stating " I am just calling to follow up and see if the MRI reading has been compared to my prior one "  This RN contacted [facility]- MRI performed at [facility] - and was transferred to the [facility] due to "all breast mri's are read at the breast center ".  Per the Breast Center films from [facility] not showing as received.  This RN contacted [employer] in [place] at [phone] and was informed films were requested and taken to the mail room on 02/19/2018.  Nurse with [facility]/NP had already spoken to the patient- informing her as soon as comparison reading available she will be contacted.  Note pt is very anxious regarding " area that they cannot tell me is cancer or not " per MRI from 02/16/2018.  Above is to document information if patient calls again.

## 2018-02-23 ENCOUNTER — Other Ambulatory Visit: Payer: Self-pay | Admitting: Adult Health

## 2018-02-23 ENCOUNTER — Other Ambulatory Visit: Payer: Self-pay | Admitting: Oncology

## 2018-02-23 DIAGNOSIS — R599 Enlarged lymph nodes, unspecified: Secondary | ICD-10-CM

## 2018-02-23 NOTE — Progress Notes (Signed)
Called Meghan Russell and told her that in my opinion since the lymph nodes are bilateral they are unlikely to be due to her left-sided breast cancer.  Of course it could be due to a different cancer such as lymphoma, they could be due to sarcoid, that could be due 20 number of inflammatory or infectious conditions.  We simply have to wait on the pathology results and she knows that it is probably going to be June 3 or 4 before we have that  She knows to call for any other issues that may develop before the next visit.

## 2018-03-01 ENCOUNTER — Ambulatory Visit
Admission: RE | Admit: 2018-03-01 | Discharge: 2018-03-01 | Disposition: A | Discharge status: Home / Self Care | Payer: No Typology Code available for payment source | Source: Ambulatory Visit | Attending: Adult Health | Admitting: Adult Health

## 2018-03-01 ENCOUNTER — Other Ambulatory Visit: Payer: Self-pay | Admitting: Adult Health

## 2018-03-01 DIAGNOSIS — R599 Enlarged lymph nodes, unspecified: Secondary | ICD-10-CM

## 2018-04-02 ENCOUNTER — Other Ambulatory Visit: Payer: Self-pay

## 2018-04-02 DIAGNOSIS — C50912 Malignant neoplasm of unspecified site of left female breast: Secondary | ICD-10-CM

## 2018-04-02 NOTE — Progress Notes (Signed)
[place]  [phone] [phone]     ID: [name] DOB: 19-Jun-1971  MR#: [id number]  DTO#:[id number]  Patient Care Team: Velna Hatchet, MD as PCP - General (Internal Medicine) Tensley Wery, Virgie Dad, MD as Consulting Physician (Oncology) Armbruster, Carlota Raspberry, MD as Consulting Physician (Gastroenterology) Aloha Gell, MD as Consulting Physician (Obstetrics and Gynecology) PCP: Velna Hatchet, MD OTHER MD:  CHIEF COMPLAINT: Triple positive breast cancer  CURRENT TREATMENT: Observation   BREAST CANCER HISTORY: From the original intake note:  [name] noted a change in her right breast shortly after the birth of her son in 2010. This took her to mammography, and it turned out the right breast was fine but there was an abnormality in the left breast. On 09/11/2009 she underwent left breast ultrasound-guided biopsy which showed [phone] in [facility]) and invasive carcinoma which was estrogen receptor 81% positive, progesterone receptor 100% positive, HER-2 by IH C was 3.8 (overexpressed), with an MIB-1 of 30%. P53 was 3%. Fish confirmed the HER-2 positivity with a signals ratio of 6.6.  The patient explore various surgical options and decided to undergo bilateral mastectomies with D IEP reconstruction. This was performed on 11/12/2009, with left sentinel lymph node sampling. The accession number is [id number] at [facility] in [place], [place] The right prophylactic mastectomy was benign. On the left side there was no evidence of residual tumor on initial views, but gross recut section showed an infiltrating ductal carcinoma, intermediate grade, measuring 1.2 cm. HER-2 was repeated and was again amplified with a ratio of 6.2 estrogen receptor was 90% positive with moderate intensity, progesterone receptor was 90% positive with strong intensity, Ki-67 was 40%. The single sentinel lymph node was  benign.  She received adjuvant chemotherapy in [facility] consisting of docetaxel, carboplatin and trastuzumab x6, trastuzumab was continued to complete a year. Echocardiogram 08/12/2010 showed an ejection fraction in the 70% range  The patient was started on tamoxifen August 2011, took it intermittently until January 2014 (perhaps a total of 2 or 2-1/2 years) before discontinuing it because of problems with hot flashes.  Her subsequent history is as detailed below.  INTERVAL HISTORY: Meghan Russell returns today for follow up of her triple positive breast cancer. She continues under observation.   Since her last visit, she completed a bilateral breast MRI on 02/15/2018 showing: Breast density category A.  The large area of rim enhancing fat necrosis in the upper central to medial left breast appears similar when compared to the prior exam from 2014 and is therefore considered a benign finding.  Ultrasound of both breasts on 03/01/2018 showed: Multiple BILATERAL axillary lymph nodes demonstrating cortical thickening. Bilateral axillary lymph nodes appear larger with increased cortical thickening when compared to prior breast MRI from 2014.  Biopsy of a left axillary lymph node ([id number]) on 03/01/2018 showed;: Reactive lymph node with dermatopathic change but no evidence of malignancy.  REVIEW OF SYSTEMS: Meghan Russell reports that she was worried throughout May because of the enlarged lymph node. She notes that her breast is hard and she previously had it drained in 2014. She notes that she continues to have some pain in the left breast. She notes that her insurance has not covered some of her health expenses. She has intermittent periods that occur about every other month. She has hot flashes that can be brought on by mood. She takes xanax to calm her nerves and irritability. For exercise, she walks and goes to the gym.  She denies unusual headaches, visual changes, nausea, vomiting, or dizziness. There has  been no unusual cough, phlegm production, or pleurisy. This been no change in bowel or bladder habits. She denies unexplained fatigue or unexplained weight loss, bleeding, rash, or fever. A detailed review of systems was otherwise stable.    PAST MEDICAL HISTORY: Past Medical History:  Diagnosis Date  . Anxiety   . Breast cancer ([place]) 2010   left breast    PAST SURGICAL HISTORY: Past Surgical History:  Procedure Laterality Date  . APPENDECTOMY    . LASIK    . MANDIBLE FRACTURE SURGERY    . MASTECTOMY W/ SENTINEL NODE BIOPSY      FAMILY HISTORY Family History  Problem Relation Age of Onset  . Colon cancer Mother 63       2-3 colon polyps  . Prostate cancer Maternal Grandfather 28  . Colon cancer Maternal Grandfather        dx. >76  . Bone cancer Maternal Grandfather        dx. >76  . Bladder Cancer Paternal Grandmother 35   The patient's parents are still living, both 16 years old as of July 2016. The patient had 2 brothers, no sisters. The only cancer in the family was the patient's mother, diagnosed with colon cancer at age 87. More specifically there is no history of breast or ovarian cancer in the family to the patient's knowledge  GYNECOLOGIC HISTORY:  No LMP recorded. Menarche age 44, first live birth age 70, the patient is [place] P2. Her most recent period was 01/27/2015. 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 Her husband Herbie Baltimore works for [employer] as a Freight forwarder. Their children are [name] 9, and [name] 6. The patient at tends [place]    ADVANCED DIRECTIVES: Not in place   HEALTH MAINTENANCE: Social History   Tobacco Use  . Smoking status: Former Smoker    Packs/day: 0.25    Years: 15.00    Pack years: 3.75    Types: Cigarettes    Last attempt to quit: 10/03/2013    Years since quitting: 4.5  . Smokeless tobacco:  Never Used  . Tobacco comment: sometimes on and off; did not smoke for 4 yrs following her breast cancer dx  Substance Use Topics  . Alcohol use: Yes    Alcohol/week: 3.0 oz    Types: 5 Cans of beer per week    Comment: socially/on the weekends  . Drug use: No     Colonoscopy:  PAP: June 2015  Bone density:  Lipid panel:  Allergies  Allergen Reactions  . Sulfa Antibiotics Hives    Current Outpatient Medications  Medication Sig Dispense Refill  . ALPRAZolam (XANAX) 0.25 MG tablet Take 0.25 mg by mouth at bedtime as needed for anxiety.     No current facility-administered medications for this visit.     OBJECTIVE: Beery white woman who appears stated age  74:   04/03/18 1149  BP: 116/78  Pulse: 70  Resp: 17  Temp: 98.5 F (36.9 C)  SpO2: 99%     Body mass index is 36.93 kg/m.    ECOG FS:0 - Asymptomatic   Filed Weights   04/03/18 1149  Weight: 228 lb 12.8 oz (103.8 kg)   Sclerae unicteric, EOMs intact Oropharynx clear and moist No cervical or supraclavicular adenopathy Lungs no rales  or rhonchi Heart regular rate and rhythm Abd soft, nontender, positive bowel sounds MSK no focal spinal tenderness, no upper extremity lymphedema Neuro: nonfocal, well oriented, appropriate affect Breasts: Status post bilateral mastectomies with DIEP reconstruction.  On the left there is a seroma which has been present previously and does not appear to have changed.  Both axillae are benign.     LAB RESULTS:  CMP     Component Value Date/Time   NA 138 04/03/2018 1133   NA 140 04/03/2017 1432   K 4.3 04/03/2018 1133   K 3.8 04/03/2017 1432   CL 102 04/03/2018 1133   CO2 28 04/03/2018 1133   CO2 21 (L) 04/03/2017 1432   GLUCOSE 95 04/03/2018 1133   GLUCOSE 93 04/03/2017 1432   BUN 12 04/03/2018 1133   BUN 9.6 04/03/2017 1432   CREATININE 0.83 04/03/2018 1133   CREATININE 0.8 04/03/2017 1432   CALCIUM 10.1 04/03/2018 1133   CALCIUM 9.6 04/03/2017 1432   PROT 7.7  04/03/2018 1133   PROT 7.2 04/03/2017 1432   ALBUMIN 4.7 04/03/2018 1133   ALBUMIN 4.2 04/03/2017 1432   AST 19 04/03/2018 1133   AST 21 04/03/2017 1432   ALT 21 04/03/2018 1133   ALT 22 04/03/2017 1432   ALKPHOS 84 04/03/2018 1133   ALKPHOS 68 04/03/2017 1432   BILITOT 0.5 04/03/2018 1133   BILITOT 0.42 04/03/2017 1432   GFRNONAA >60 04/03/2018 1133   GFRAA >60 04/03/2018 1133    INo results found for: SPEP, UPEP  Lab Results  Component Value Date   WBC 3.0 (L) 04/03/2018   NEUTROABS 1.5 04/03/2018   HGB 13.3 04/03/2018   HCT 38.9 04/03/2018   MCV 88.6 04/03/2018   PLT 163 04/03/2018      Chemistry      Component Value Date/Time   NA 138 04/03/2018 1133   NA 140 04/03/2017 1432   K 4.3 04/03/2018 1133   K 3.8 04/03/2017 1432   CL 102 04/03/2018 1133   CO2 28 04/03/2018 1133   CO2 21 (L) 04/03/2017 1432   BUN 12 04/03/2018 1133   BUN 9.6 04/03/2017 1432   CREATININE 0.83 04/03/2018 1133   CREATININE 0.8 04/03/2017 1432      Component Value Date/Time   CALCIUM 10.1 04/03/2018 1133   CALCIUM 9.6 04/03/2017 1432   ALKPHOS 84 04/03/2018 1133   ALKPHOS 68 04/03/2017 1432   AST 19 04/03/2018 1133   AST 21 04/03/2017 1432   ALT 21 04/03/2018 1133   ALT 22 04/03/2017 1432   BILITOT 0.5 04/03/2018 1133   BILITOT 0.42 04/03/2017 1432       No results found for: LABCA2  No components found for: LABCA125  No results for input(s): INR in the last 168 hours.  Urinalysis No results found for: COLORURINE, APPEARANCEUR, LABSPEC, PHURINE, GLUCOSEU, HGBUR, BILIRUBINUR, KETONESUR, PROTEINUR, UROBILINOGEN, NITRITE, LEUKOCYTESUR  STUDIES: Since her last visit, she completed a bilateral breast MRI on 02/15/2018 showing: The large area of rim enhancing fat necrosis in the upper central to medial left breast appears similar when compared to the prior exam from 2014 and is therefore considered a benign finding.  Ultrasound of the bilateral breasts on 03/01/2018 showed:  Multiple BILATERAL axillary lymph nodes demonstrating cortical thickening. Bilateral axillary lymph nodes appear larger with increased cortical thickening when compared to prior breast MRI from 2014.  Biopsy of the left axillary lymph node ([id number]) on 03/01/2018 showed no evidence of malignancy.    ASSESSMENT: 47 y.o.  [place] woman status post bilateral mastectomies 11/12/2009 for a left sided pT1c N0, stage IA invasive ductal carcinoma, grade 2, triple positive, with an MIB-1 of 40%  (a) status post immediate bilateral DIEP reconstruction  (b) area of fat necrosis in the left breast documented March 2014  (1) adjuvant chemotherapy consisted of carboplatin, docetaxel and trastuzumab given every 3 weeks 6  (2) trastuzumab was continued to complete a year, with echocardiogram post treatment showing a normal ejection fraction  (3) tamoxifen taken intermittently to total about 2 years, definitively discontinued January 2014 I, Lurline Del MD, have reviewed the above documentation for accuracy and completeness, and I agree with the above.  (4) genetics testing recommended but refused by the patient--initially because of cost concerns, subsequently because she "doesn't want to know".. We had planned to request r the 32-gene Comprehensive Cancer Panel through [facility] Hope Pigeon, MD).  The Comprehensive Cancer Panel offered by [facility] includes sequencing and deletion/duplication testing of the following 30 genes: APC, ATM, AXIN2, BARD1, BMPR1A, BRCA1, BRCA2, BRIP1, CDH1, CDK4, CDKN2A, CHEK2, FANCC, MLH1, MSH2, MSH6, MUTYH, NBN, PALB2, PMS2, POLD1, POLE, PTEN, RAD51C, RAD51D, SMAD4, STK11, TP53, VHL, and XRCC2.  This panel also includes deletion/duplication analysis (without sequencing) for two genes, EPCAM and SCG5/GREM1.  (a) colonoscopy was recommended and performed 02/10/2016 showing 3 tubular adenomas with without high-grade dysplasia  PLAN: Keiara continues to feel very  vulnerable about her situation.  This occasionally gets the better of her and then she feels very irritable and moody.  I think a low dose of venlafaxine may be helpful to her especially as she is beginning to experience hot flashes.  We reviewed her recent studies which are reassuring.  I think it would be prudent to repeat the ultrasounds of the axillae in November and I have arranged for that  She would like her seroma drained and does not want to see 1 of our plastic surgeons locally since they do not do the type of reconstruction she had previously.  Unfortunately the surgeon who did her reconstruction is not available.  I am referring her to [place] with that in mind.  As far as the anxiety is concerned we discussed exercise, meditation, and tai chi.  Once she is fully menopausal we will start anastrozole  She knows to call for any problems that may develop before her next visit here.

## 2018-04-03 ENCOUNTER — Encounter: Payer: Self-pay | Admitting: Oncology

## 2018-04-03 ENCOUNTER — Inpatient Hospital Stay: Payer: No Typology Code available for payment source

## 2018-04-03 ENCOUNTER — Inpatient Hospital Stay: Payer: No Typology Code available for payment source | Attending: Adult Health | Admitting: Oncology

## 2018-04-03 ENCOUNTER — Telehealth: Payer: Self-pay

## 2018-04-03 VITALS — BP 116/78 | HR 70 | Temp 98.5°F | Resp 17 | Ht 66.0 in | Wt 228.8 lb

## 2018-04-03 DIAGNOSIS — Z853 Personal history of malignant neoplasm of breast: Secondary | ICD-10-CM | POA: Diagnosis not present

## 2018-04-03 DIAGNOSIS — N951 Menopausal and female climacteric states: Secondary | ICD-10-CM | POA: Insufficient documentation

## 2018-04-03 DIAGNOSIS — F419 Anxiety disorder, unspecified: Secondary | ICD-10-CM | POA: Insufficient documentation

## 2018-04-03 DIAGNOSIS — Z800 Family history of malignant neoplasm of digestive organs: Secondary | ICD-10-CM | POA: Diagnosis not present

## 2018-04-03 DIAGNOSIS — Z8371 Family history of colonic polyps: Secondary | ICD-10-CM | POA: Insufficient documentation

## 2018-04-03 DIAGNOSIS — C50812 Malignant neoplasm of overlapping sites of left female breast: Secondary | ICD-10-CM

## 2018-04-03 DIAGNOSIS — Z87891 Personal history of nicotine dependence: Secondary | ICD-10-CM | POA: Insufficient documentation

## 2018-04-03 DIAGNOSIS — Z79899 Other long term (current) drug therapy: Secondary | ICD-10-CM | POA: Insufficient documentation

## 2018-04-03 DIAGNOSIS — Z170 Estrogen receptor positive status [ER+]: Secondary | ICD-10-CM

## 2018-04-03 DIAGNOSIS — C50912 Malignant neoplasm of unspecified site of left female breast: Secondary | ICD-10-CM

## 2018-04-03 LAB — CMP (CANCER CENTER ONLY)
ALK PHOS: 84 U/L (ref 38–126)
ALT: 21 U/L (ref 0–44)
AST: 19 U/L (ref 15–41)
Albumin: 4.7 g/dL (ref 3.5–5.0)
Anion gap: 8 (ref 5–15)
BUN: 12 mg/dL (ref 6–20)
CO2: 28 mmol/L (ref 22–32)
CREATININE: 0.83 mg/dL (ref 0.44–1.00)
Calcium: 10.1 mg/dL (ref 8.9–10.3)
Chloride: 102 mmol/L (ref 98–111)
Glucose, Bld: 95 mg/dL (ref 70–99)
Potassium: 4.3 mmol/L (ref 3.5–5.1)
Sodium: 138 mmol/L (ref 135–145)
Total Bilirubin: 0.5 mg/dL (ref 0.3–1.2)
Total Protein: 7.7 g/dL (ref 6.5–8.1)

## 2018-04-03 LAB — CBC WITH DIFFERENTIAL (CANCER CENTER ONLY)
Basophils Absolute: 0 10*3/uL (ref 0.0–0.1)
Basophils Relative: 0 %
EOS PCT: 2 %
Eosinophils Absolute: 0.1 10*3/uL (ref 0.0–0.5)
HCT: 38.9 % (ref 34.8–46.6)
HEMOGLOBIN: 13.3 g/dL (ref 11.6–15.9)
Lymphocytes Relative: 38 %
Lymphs Abs: 1.1 10*3/uL (ref 0.9–3.3)
MCH: 30.3 pg (ref 25.1–34.0)
MCHC: 34.2 g/dL (ref 31.5–36.0)
MCV: 88.6 fL (ref 79.5–101.0)
MONOS PCT: 9 %
Monocytes Absolute: 0.3 10*3/uL (ref 0.1–0.9)
NEUTROS PCT: 51 %
Neutro Abs: 1.5 10*3/uL (ref 1.5–6.5)
PLATELETS: 163 10*3/uL (ref 145–400)
RBC: 4.39 MIL/uL (ref 3.70–5.45)
RDW: 12.9 % (ref 11.2–14.5)
WBC: 3 10*3/uL — AB (ref 3.9–10.3)

## 2018-04-03 MED ORDER — VENLAFAXINE HCL ER 37.5 MG PO CP24: 37.5000 mg | ORAL_CAPSULE | Freq: Every day | ORAL | 4 refills | Status: DC

## 2018-04-03 NOTE — Telephone Encounter (Signed)
Printed avs and calender of upcoming appointment per 7/2 los.

## 2018-05-01 ENCOUNTER — Telehealth: Payer: Self-pay | Admitting: *Deleted

## 2018-05-01 NOTE — Telephone Encounter (Signed)
Pt left VM stating need for records to be faxed to Dr [facility] office per referral made earlier this month for scheduling of appointment.  This RN faxed records to [phone] with received confirmation.  This RN returned call to pt and obtained VM- message left per above.

## 2018-08-20 ENCOUNTER — Ambulatory Visit: Payer: No Typology Code available for payment source | Admitting: Oncology

## 2018-09-03 ENCOUNTER — Other Ambulatory Visit: Payer: No Typology Code available for payment source

## 2018-09-17 ENCOUNTER — Ambulatory Visit: Payer: No Typology Code available for payment source | Admitting: Oncology

## 2018-09-20 ENCOUNTER — Other Ambulatory Visit: Payer: Self-pay

## 2018-10-15 ENCOUNTER — Telehealth: Payer: Self-pay | Admitting: Oncology

## 2018-10-15 NOTE — Telephone Encounter (Signed)
R/s appt per 1/10 sch message - left message for patient with appt date and time and sent reminder letter in the mail.

## 2018-11-15 ENCOUNTER — Ambulatory Visit
Admission: RE | Admit: 2018-11-15 | Discharge: 2018-11-15 | Disposition: A | Discharge status: Home / Self Care | Payer: 59 | Source: Ambulatory Visit | Attending: Oncology | Admitting: Oncology

## 2018-11-15 DIAGNOSIS — Z170 Estrogen receptor positive status [ER+]: Principal | ICD-10-CM

## 2018-11-15 DIAGNOSIS — C50812 Malignant neoplasm of overlapping sites of left female breast: Secondary | ICD-10-CM

## 2018-11-22 ENCOUNTER — Ambulatory Visit: Payer: Self-pay | Admitting: Oncology

## 2018-11-23 ENCOUNTER — Telehealth: Payer: Self-pay | Admitting: Oncology

## 2018-11-23 NOTE — Telephone Encounter (Signed)
Called patient per scheduling VM log.  Cancelled the appt per patient request and transferred call to the MD nurse because she want to ask about a referral.

## 2018-11-28 ENCOUNTER — Ambulatory Visit: Payer: Self-pay | Admitting: Oncology

## 2018-12-04 ENCOUNTER — Telehealth: Payer: Self-pay | Admitting: *Deleted

## 2018-12-04 ENCOUNTER — Other Ambulatory Visit: Payer: Self-pay | Admitting: *Deleted

## 2018-12-04 DIAGNOSIS — C50912 Malignant neoplasm of unspecified site of left female breast: Secondary | ICD-10-CM

## 2018-12-04 NOTE — Telephone Encounter (Signed)
I have placed a referral to Dr Nadene Rubins at [facility] - sent an appointment request as well as this note will be sent to HIM for referral and records to be faxed

## 2018-12-04 NOTE — Telephone Encounter (Signed)
"  Meghan Russell [phone]).  I've moved to [place].  Awaiting call in reference to referral to oncologist in [place].  Any of Dr. Virgie Dad top picks with [facility] would be fine.  I'm doing well.  Breast US was great.  I hope Dr. Jana Hakim received the message I left thanking him.  Dr. Jana Hakim is an excellent doctor, has provided the greatest care."

## 2018-12-04 NOTE — Telephone Encounter (Signed)
Any recommendations?

## 2018-12-05 ENCOUNTER — Telehealth: Payer: Self-pay | Admitting: Oncology

## 2018-12-05 NOTE — Telephone Encounter (Signed)
Faxed medical records to Dr. Nadene Rubins at [facility] for a referral, Release ID: [id number]

## 2019-03-04 ENCOUNTER — Other Ambulatory Visit: Payer: Self-pay | Admitting: Oncology

## 2019-03-04 ENCOUNTER — Ambulatory Visit
Admission: RE | Admit: 2019-03-04 | Discharge: 2019-03-04 | Disposition: A | Discharge status: Home / Self Care | Payer: BLUE CROSS/BLUE SHIELD | Source: Ambulatory Visit | Attending: Oncology | Admitting: Oncology

## 2019-03-04 ENCOUNTER — Ambulatory Visit
Admission: RE | Admit: 2019-03-04 | Discharge: 2019-03-04 | Disposition: A | Discharge status: Home / Self Care | Payer: BC Managed Care – PPO | Source: Ambulatory Visit | Attending: Oncology | Admitting: Oncology

## 2019-03-04 ENCOUNTER — Telehealth: Payer: Self-pay | Admitting: *Deleted

## 2019-03-04 ENCOUNTER — Other Ambulatory Visit: Payer: Self-pay | Admitting: *Deleted

## 2019-03-04 ENCOUNTER — Encounter: Payer: Self-pay | Admitting: Oncology

## 2019-03-04 ENCOUNTER — Other Ambulatory Visit: Payer: Self-pay

## 2019-03-04 ENCOUNTER — Inpatient Hospital Stay: Payer: BC Managed Care – PPO | Attending: Oncology | Admitting: Oncology

## 2019-03-04 VITALS — BP 126/90 | HR 105 | Temp 98.0°F | Resp 18

## 2019-03-04 DIAGNOSIS — C50812 Malignant neoplasm of overlapping sites of left female breast: Secondary | ICD-10-CM

## 2019-03-04 DIAGNOSIS — Z170 Estrogen receptor positive status [ER+]: Secondary | ICD-10-CM

## 2019-03-04 DIAGNOSIS — C50912 Malignant neoplasm of unspecified site of left female breast: Secondary | ICD-10-CM

## 2019-03-04 DIAGNOSIS — Z87891 Personal history of nicotine dependence: Secondary | ICD-10-CM | POA: Insufficient documentation

## 2019-03-04 DIAGNOSIS — Z9221 Personal history of antineoplastic chemotherapy: Secondary | ICD-10-CM | POA: Diagnosis not present

## 2019-03-04 DIAGNOSIS — Z853 Personal history of malignant neoplasm of breast: Secondary | ICD-10-CM | POA: Diagnosis not present

## 2019-03-04 DIAGNOSIS — F419 Anxiety disorder, unspecified: Secondary | ICD-10-CM | POA: Diagnosis not present

## 2019-03-04 DIAGNOSIS — Z800 Family history of malignant neoplasm of digestive organs: Secondary | ICD-10-CM | POA: Diagnosis not present

## 2019-03-04 MED ORDER — CIPROFLOXACIN HCL 500 MG PO TABS: 500.0000 mg | ORAL_TABLET | Freq: Two times a day (BID) | ORAL | 0 refills | Status: AC

## 2019-03-04 NOTE — Progress Notes (Signed)
Meghan Russell  [phone] [phone]     ID: [name] DOB: 27-Apr-1971  MR#: [id number]  ZSW#:[id number]  Patient Care Team: Velna Hatchet, MD as PCP - General (Internal Medicine) Terrilynn Postell, Virgie Dad, MD as Consulting Physician (Oncology) Armbruster, Carlota Raspberry, MD as Consulting Physician (Gastroenterology) Aloha Gell, MD as Consulting Physician (Obstetrics and Gynecology) OTHER MD:  CHIEF COMPLAINT: Triple positive breast cancer  CURRENT TREATMENT: Observation   INTERVAL HISTORY: Meghan Russell returns today for follow up of her triple positive breast cancer. She continues under observation. She is being seen today regarding concern for "new enlargement" in her left breast.  She noticed a slight change in her left breast about a month ago. In the last few days it seemed to be getting larger, then redder. There was slight pain when she felt it. She drove from [place] (where she still has not established herself with an oncologist) so we could evaluate her.  Since her last visit, she underwent bilateral axilla ultrasound on 11/15/2018 for follow up of questionable lymph nodes. The scan showed: stable bilateral axillary lymph nodes with mildly thickened cortices; given stability and benign tissue sampling of one of the enlarged left axillary lymph nodes ([id number]), these are considered benign.   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 Aside from the breast changes noted above she denies unusual headaches, visual changes, nausea, vomiting, stiff neck, dizziness, or gait imbalance. There has been no cough, phlegm production, or pleurisy, no chest pain or pressure, and no change in bowel or bladder habits. The patient denies fever,  bleeding, unexplained fatigue or unexplained weight loss. A detailed review of systems was otherwise entirely  negative.   BREAST CANCER HISTORY: From the original intake note:  [name] noted a change in her right breast shortly after the birth of her son in 2010. This took her to mammography, and it turned out the right breast was fine but there was an abnormality in the left breast. On 09/11/2009 she underwent left breast ultrasound-guided biopsy which showed [id number] in [facility]) and invasive carcinoma which was estrogen receptor 81% positive, progesterone receptor 100% positive, HER-2 by IH C was 3.8 (overexpressed), with an MIB-1 of 30%. P53 was 3%. Fish confirmed the HER-2 positivity with a signals ratio of 6.6.  The patient explore various surgical options and decided to undergo bilateral mastectomies with D IEP reconstruction. This was performed on 11/12/2009, with left sentinel lymph node sampling. The accession number is [id number] at [facility] in [place], [place] The right prophylactic mastectomy was benign. On the left side there was no evidence of residual tumor on initial views, but gross recut section showed an infiltrating ductal carcinoma, intermediate grade, measuring 1.2 cm. HER-2 was repeated and was again amplified with a ratio of 6.2 estrogen receptor was 90% positive with moderate intensity, progesterone receptor was 90% positive with strong intensity, Ki-67 was 40%. The single sentinel lymph node was benign.  She received adjuvant chemotherapy in [facility] consisting of docetaxel, carboplatin and trastuzumab x6, trastuzumab was continued to complete a year. Echocardiogram 08/12/2010 showed an ejection fraction in the 70% range  The patient was started on tamoxifen August 2011, took it intermittently until January 2014 (perhaps a total of 2 or 2-1/2 years) before discontinuing it because of problems with hot flashes.  Her subsequent history  is as detailed below.   PAST MEDICAL HISTORY: Past Medical History:  Diagnosis  Date   Anxiety    Breast cancer ([place]) 2010   left breast    PAST SURGICAL HISTORY: Past Surgical History:  Procedure Laterality Date   APPENDECTOMY     LASIK     MANDIBLE FRACTURE SURGERY     MASTECTOMY W/ SENTINEL NODE BIOPSY      FAMILY HISTORY Family History  Problem Relation Age of Onset   Colon cancer Mother 68       2-3 colon polyps   Prostate cancer Maternal Grandfather 56   Colon cancer Maternal Grandfather        dx. >76   Bone cancer Maternal Grandfather        dx. >76   Bladder Cancer Paternal Grandmother 63   As of July 2016: the patient's parents are still living, both 20 years old. The patient had 2 brothers, no sisters. The only cancer in the family was the patient's mother, diagnosed with colon cancer at age 80. More specifically there is no history of breast or ovarian cancer in the family to the patient's knowledge   GYNECOLOGIC HISTORY: (updated June 2020) No LMP recorded. Menarche age 32, first live birth age 32, the patient is [place] P2. She took birth control pills remotely for approximately 15 years with no complications. She is still having periods though very irregularly.    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 Her husband Herbie [place] works for [employer] as a Freight forwarder. Their children are [name] 22, and [name] 10. The patient is Catholic.    ADVANCED DIRECTIVES: In the absence of documentation to the contrary the patient's husband is her HCPOA   HEALTH MAINTENANCE: Social History   Tobacco Use   Smoking status: Former Smoker    Packs/day: 0.25    Years: 15.00    Pack years: 3.75    Types: Cigarettes    Last attempt to quit: 10/03/2013    Years since quitting: 5.4   Smokeless tobacco: Never Used   Tobacco comment: sometimes on and off; did not smoke for 4 yrs following her breast cancer dx  Substance Use Topics   Alcohol use: Yes    Alcohol/week: 5.0  standard drinks    Types: 5 Cans of beer per week    Comment: socially/on the weekends   Drug use: No     Allergies  Allergen Reactions   Sulfa Antibiotics Hives    Current Outpatient Medications  Medication Sig Dispense Refill   ALPRAZolam (XANAX) 0.25 MG tablet Take 0.25 mg by mouth at bedtime as needed for anxiety.     ciprofloxacin (CIPRO) 500 MG tablet Take 1 tablet (500 mg total) by mouth 2 (two) times daily. 10 tablet 0   OVER THE COUNTER MEDICATION Vitamin D Daily     OVER THE COUNTER MEDICATION Calcium with Vitamin D     OVER THE COUNTER MEDICATION Allergy Pill daily     No current facility-administered medications for this visit.     OBJECTIVE: [place] white woman in no acute distress  Vitals:   03/04/19 1644  BP: 126/90  Pulse: (!) 105  Resp: 18  Temp: 98 F (36.7 C)     There is no height or weight on file to calculate BMI.    ECOG FS:0 - Asymptomatic   There were no vitals filed for this visit.  Sclerae unicteric, EOMs  intact Wearing a mask No cervical or supraclavicular adenopathy Lungs no rales or rhonchi Heart regular rate and rhythm Abd soft, nontender, positive bowel sounds MSK no focal spinal tenderness, no upper extremity lymphedema Neuro: nonfocal, well oriented, anxious affect Breasts: s/p bilateral mastectomies with DIEP reconstruction.  The right reconstructed breast is unremarkable.  The left reconstructed breast has a seroma in the upper outer quadrant which is unchanged from baseline, not tender to palpation, not associated with erythema.  In the medial aspect of the left breast there is an area of induration measuring approximately 3-1/2 x 1-1/2 cm, movable, with associated erythema.  This is imaged below.  Both axillae are benign.   Left breast 03/04/2019      LAB RESULTS:  CMP     Component Value Date/Time   NA 138 04/03/2018 1133   NA 140 04/03/2017 1432   K 4.3 04/03/2018 1133   K 3.8 04/03/2017 1432   CL 102 04/03/2018  1133   CO2 28 04/03/2018 1133   CO2 21 (L) 04/03/2017 1432   GLUCOSE 95 04/03/2018 1133   GLUCOSE 93 04/03/2017 1432   BUN 12 04/03/2018 1133   BUN 9.6 04/03/2017 1432   CREATININE 0.83 04/03/2018 1133   CREATININE 0.8 04/03/2017 1432   CALCIUM 10.1 04/03/2018 1133   CALCIUM 9.6 04/03/2017 1432   PROT 7.7 04/03/2018 1133   PROT 7.2 04/03/2017 1432   ALBUMIN 4.7 04/03/2018 1133   ALBUMIN 4.2 04/03/2017 1432   AST 19 04/03/2018 1133   AST 21 04/03/2017 1432   ALT 21 04/03/2018 1133   ALT 22 04/03/2017 1432   ALKPHOS 84 04/03/2018 1133   ALKPHOS 68 04/03/2017 1432   BILITOT 0.5 04/03/2018 1133   BILITOT 0.42 04/03/2017 1432   GFRNONAA >60 04/03/2018 1133   GFRAA >60 04/03/2018 1133    INo results found for: SPEP, UPEP  Lab Results  Component Value Date   WBC 3.0 (L) 04/03/2018   NEUTROABS 1.5 04/03/2018   HGB 13.3 04/03/2018   HCT 38.9 04/03/2018   MCV 88.6 04/03/2018   PLT 163 04/03/2018      Chemistry      Component Value Date/Time   NA 138 04/03/2018 1133   NA 140 04/03/2017 1432   K 4.3 04/03/2018 1133   K 3.8 04/03/2017 1432   CL 102 04/03/2018 1133   CO2 28 04/03/2018 1133   CO2 21 (L) 04/03/2017 1432   BUN 12 04/03/2018 1133   BUN 9.6 04/03/2017 1432   CREATININE 0.83 04/03/2018 1133   CREATININE 0.8 04/03/2017 1432      Component Value Date/Time   CALCIUM 10.1 04/03/2018 1133   CALCIUM 9.6 04/03/2017 1432   ALKPHOS 84 04/03/2018 1133   ALKPHOS 68 04/03/2017 1432   AST 19 04/03/2018 1133   AST 21 04/03/2017 1432   ALT 21 04/03/2018 1133   ALT 22 04/03/2017 1432   BILITOT 0.5 04/03/2018 1133   BILITOT 0.42 04/03/2017 1432       No results found for: LABCA2  No components found for: LABCA125  No results for input(s): INR in the last 168 hours.  Urinalysis No results found for: COLORURINE, APPEARANCEUR, LABSPEC, PHURINE, GLUCOSEU, HGBUR, BILIRUBINUR, KETONESUR, PROTEINUR, UROBILINOGEN, NITRITE, LEUKOCYTESUR  STUDIES: Left breast  mammography and ultrasonography today is pending  ASSESSMENT: 48 y.o. [place] woman status post bilateral mastectomies 11/12/2009 for a left sided pT1c N0, stage IA invasive ductal carcinoma, grade 2, triple positive, with an MIB-1 of 40%  (a) status post  immediate bilateral DIEP reconstruction  (b) area of fat necrosis in the left breast documented March 2014  (c) left axillary lymph node biopsy 03/02/2018 shows no malignancy  (1) adjuvant chemotherapy consisted of carboplatin, docetaxel and trastuzumab given every 3 weeks 6  (2) trastuzumab was continued to complete a year, with echocardiogram post treatment showing a normal ejection fraction  (3) tamoxifen taken intermittently to total about 2 years, definitively discontinued January 2014 I, Lurline Del MD, have reviewed the above documentation for accuracy and completeness, and I agree with the above.  (4) genetics testing recommended but refused by the patient--initially because of cost concerns, subsequently because she "doesn't want to know".. We had planned to request r the 32-gene Comprehensive Cancer Panel through [facility] Hope Pigeon, MD).  The Comprehensive Cancer Panel offered by [facility] includes sequencing and deletion/duplication testing of the following 30 genes: APC, ATM, AXIN2, BARD1, BMPR1A, BRCA1, BRCA2, BRIP1, CDH1, CDK4, CDKN2A, CHEK2, FANCC, MLH1, MSH2, MSH6, MUTYH, NBN, PALB2, PMS2, POLD1, POLE, PTEN, RAD51C, RAD51D, SMAD4, STK11, TP53, VHL, and XRCC2.  This panel also includes deletion/duplication analysis (without sequencing) for two genes, EPCAM and SCG5/GREM1.  (a) colonoscopy was recommended and performed 02/10/2016 showing 3 tubular adenomas without high-grade dysplasia  PLAN: Zellie is now a little over 9 years out from definitive surgery for her breast cancer, with no evidence of disease recurrence.  This is very favorable.  She still feels very vulnerable and was very anxious about the new  developments in the left breast.  She did the right thing to seek for further evaluation.  Unfortunately she still has not established herself with the medical community and urology and so she had to drive here to get her mammography ultrasonography and exam.  I do not have results of the radiology yet but I think what I am seeing is likely fat necrosis and inflammation, plus or minus early cellulitis.  I do not think we are dealing with breast cancer recurrence  If she lives in [place] I would simply bring her back in 3 days but since she does not and does not have an appointment with oncology and neurology until late next week I am going ahead and writing for Cipro to be taken 5 days.  She will let me know if she has any problems from that medication  I would anticipate that this area will settle down and hopefully by the time she sees Dr. Tamala Julian next week will look improved.  Of course I will call her with the results of the mammogram and ultrasound today  Because of her anxiety she was wondering if she should simply have her breasts removed.  I really do not think that would solve the problem.  She could have her breast removed and then developed a hip pain and it would be very similar to this current situation.   I am not making a return appointment for her here though of course I will be glad to see her at any point in the future if and when the need arises.   Virgie Dad. Lorena Benham MD Oncology and Hematology [facility] Tel. [phone]  FAX [phone]   I, Wilburn Mylar, am acting as scribe for Dr. Virgie Dad. Jye Fariss.  I, Lurline Del MD, have reviewed the above documentation for accuracy and completeness, and I agree with the above.

## 2019-03-04 NOTE — Telephone Encounter (Signed)
This RN spoke with pt this AM per her call stating concern for " new enlargement in my left breast "  Area of enlargement is " where I had issues last year but is not enlarging and is red "  She cannot tell if it is warmer then other breast but does state tender to touch.  Area is on the inside of breast.  She has relocated to [facility] and has not been established with Oncologist ( referral placed but she cannot be seen this week ).  She has contacted the [facility] and was informed they had openings this week for exam but would need an order.  This RN discussed above- including possible need for evaluation by provider.  Plan is for this RN to contact the [place] for possible [place] today and then have pt come in for evaluation.  [place] scheduled for 230 this afternoon per call to the Breast Center.  Meghan Russell stated understanding to be at the [place] by 210 today for [place] and then to come to this office after for exam and further recommendations.

## 2019-03-05 ENCOUNTER — Encounter: Payer: Self-pay | Admitting: Oncology

## 2019-03-26 ENCOUNTER — Other Ambulatory Visit: Payer: BC Managed Care – PPO

## 2019-06-03 ENCOUNTER — Encounter: Payer: Self-pay | Admitting: Oncology

## 2019-10-29 IMAGING — MG DIGITAL DIAGNOSTIC UNILATERAL LEFT MAMMOGRAM WITH TOMO AND CAD
4 series · 5 of 12 positions shown · non-contrast
Comparison: Bilateral MRI of the reconstructed breast February 15, 2018.

CLINICAL DATA: 47-year-old patient with history of bilateral
mastectomies and tram flap reconstruction. History of left breast
cancer in 7454. She has a known approximately 5 cm oval area of fat
necrosis in the upper central medial left breast as described on
prior breast MRI.

[L CC synth-2D]
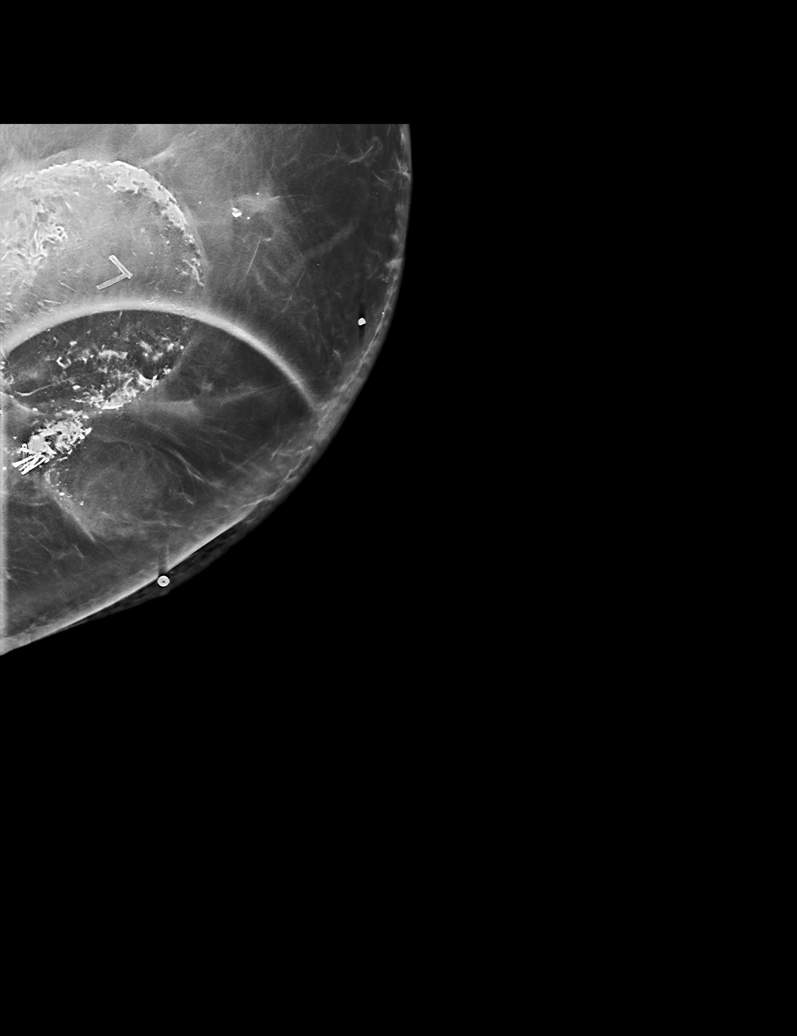

[L MLO synth-2D]
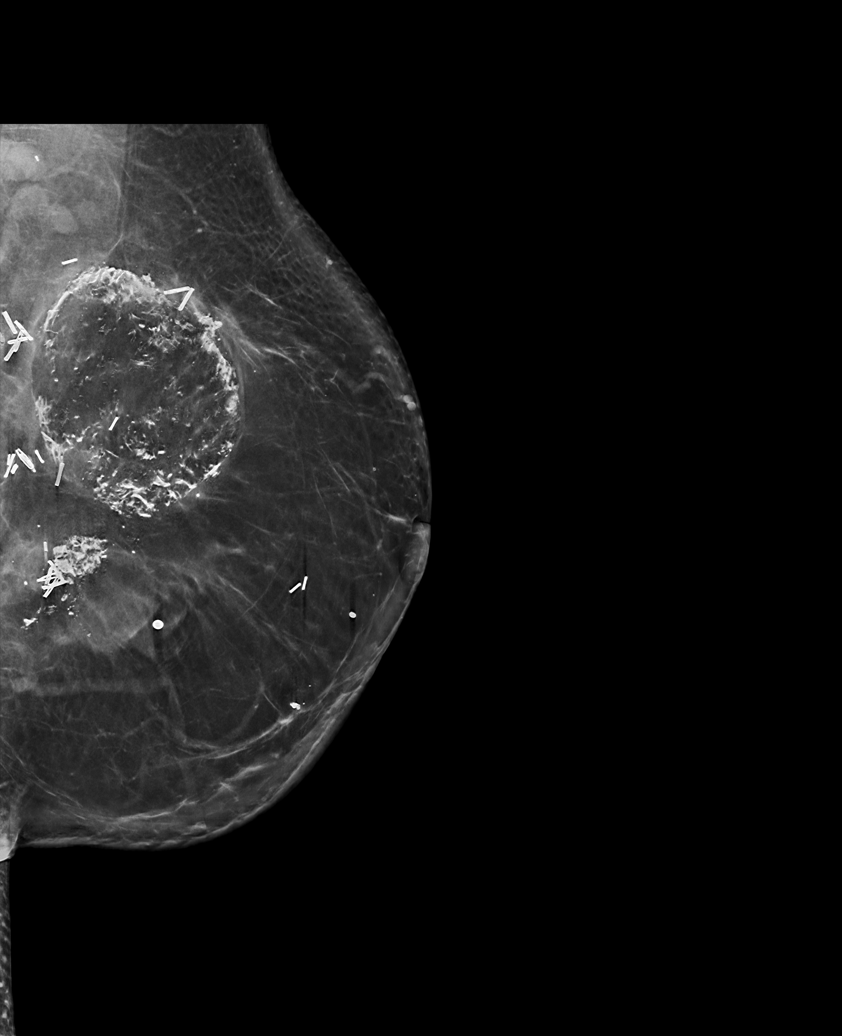

[L CC tomo · 2 of 95 frames shown]
[frame 31/95]
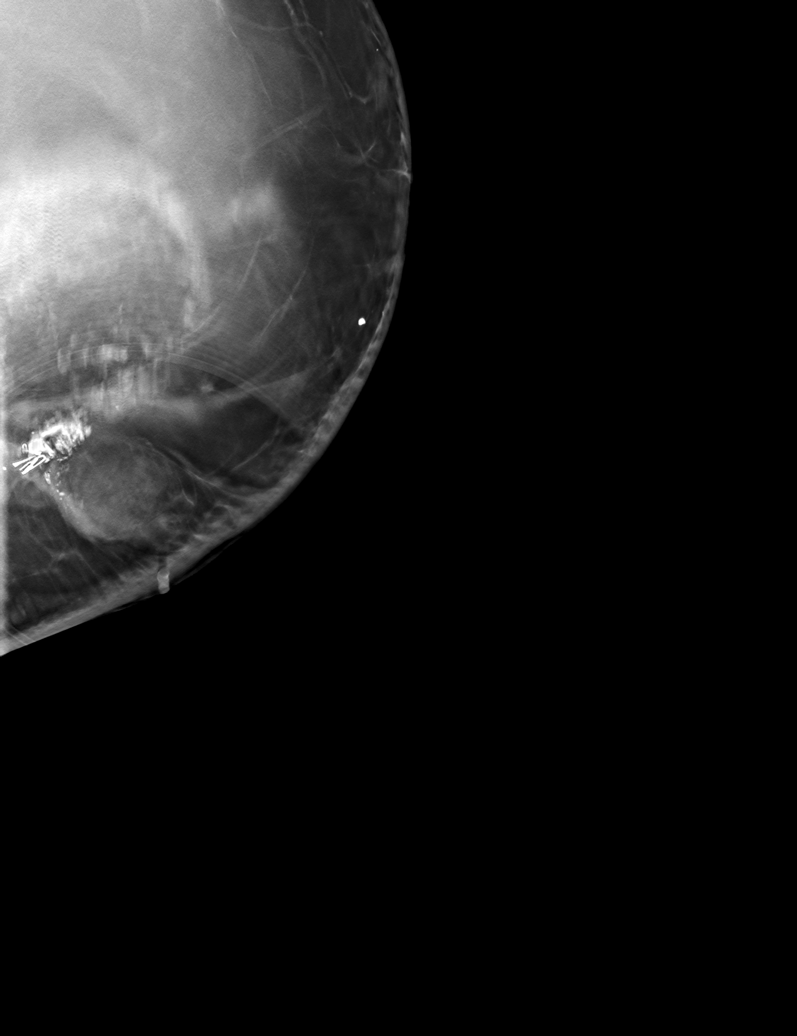
[frame 48/95]
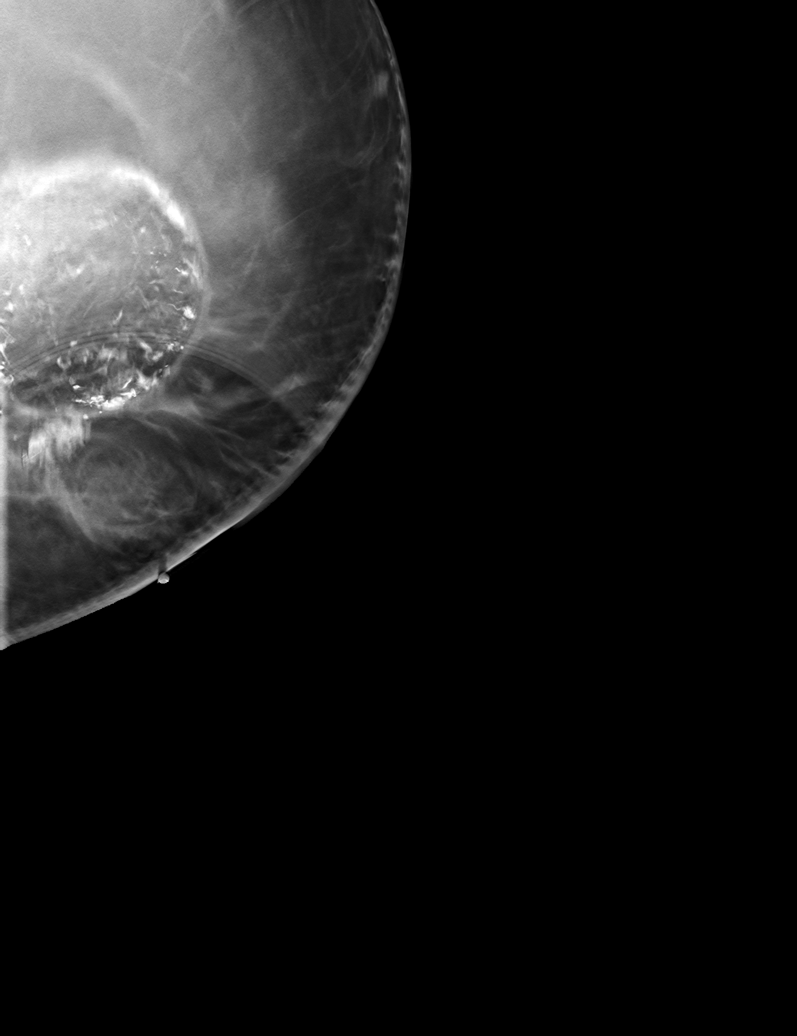

[L MLO tomo · tomo slice 51/100.0]
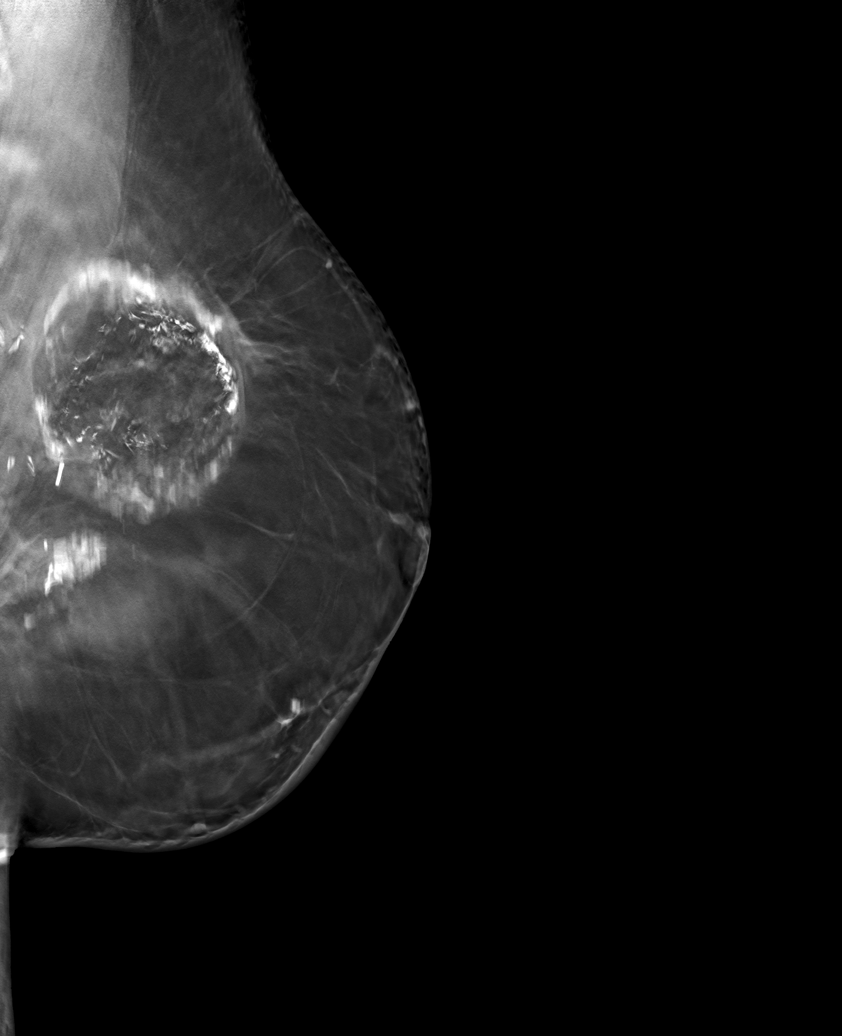

[5 of 12 positions shown; findings below may reference images not displayed]

She states that now she palpates a new and/or enlarging mass in the
medial left breast, 9 o'clock region, with some mild overlying skin
erythema. She presents for evaluation of this region today.

Prior benign left axillary lymph node biopsy in Wednesday January, 2018.

EXAM:
DIGITAL DIAGNOSTIC LEFT MAMMOGRAM WITH CAD AND TOMO

ULTRASOUND LEFT BREAST
ACR Breast Density Category b: There are scattered areas of
fibroglandular density.
FINDINGS: MLO view of the left TRAM with tomography and spot tangential
compression view of the palpable area of concern in the medial left
breast were performed today after the ultrasound to add additional
information regarding the palpable mass. Immediately deep to the
palpable mass is a circumscribed oval 2.4 cm mixed density mass with
macroscopic areas of fat. Very coarse dystrophic calcifications
consistent with fat necrosis are seen directly posterior and
slightly superior to the margin of the mass.

A well-defined oval fat containing mass with peripheral coarse
calcification is seen in the upper inner left breast consistent with
the previously described area of fat necrosis on prior breast MRI.
This large area of calcified fat necrosis measures approximately
cm greatest diameter. Surgical clips for TRAM reconstruction are
noted.

Mammographic images were processed with CAD.

On physical exam, there is a firm palpable mass in the 9 o'clock
position of the left breast centered approximately 5-6 cm from
nipple with some mild overlying skin erythema.

Targeted ultrasound of the new palpable area of concern at 9 o'clock
is performed, showing obscuration of the normal fat planes of the
TRAM flap, with mixed echogenicity but predominantly fat density
mass with indistinct margins measuring approximately 3.6 x 2.3 x
cm. The overlying skin is mildly thickened.

Ultrasound was performed in the region of the patient's chronic
palpable mass in the 11 o'clock position of the left breast 6 cm
from the nipple showing a well-defined peripherally calcified oval
mixed echogenicity mass with cystic and fat density centrally that
measures 5.0 x 5.4 x 3.7 cm and corresponds to the known discrete
fat necrosis as described on prior breast MRI of January 2018.
IMPRESSION: The new palpable area of concern in the 9 o'clock region of the left
breast has imaging features consistent with benign fat necrosis.
Mixed echogenicity mass containing macroscopic fat is seen on
mammography. The mild overlying skin changes are nonspecific, but
may be due to developing/acute fat necrosis.

Large oval chronic area of palpable fat necrosis in the 11 o'clock
region of the left breast.

RECOMMENDATION:
Clinical follow-up. The patient has an appointment this afternoon
with Dr. Billiot. The patient is aware that an ultrasound-guided
biopsy of the palpable left breast mass can be performed for
pathologic confirmation if desired, and to relieve any associated
anxiety.

I have discussed the findings and recommendations with the patient.
Results were also provided in writing at the conclusion of the
visit. If applicable, a reminder letter will be sent to the patient
regarding the next appointment.

BI-RADS CATEGORY  2: Benign.

## 2021-06-14 ENCOUNTER — Encounter: Payer: Self-pay | Admitting: Gastroenterology
